# Patient Record
Sex: Female | Born: 1935 | Race: White | Hispanic: No | Marital: Married | Smoking: Never smoker
Health system: Southern US, Community
[De-identification: ages and names within clinical notes are randomized; demographics above are authoritative.]

## PROBLEM LIST (undated history)

## (undated) DIAGNOSIS — E538 Deficiency of other specified B group vitamins: Secondary | ICD-10-CM

## (undated) DIAGNOSIS — I447 Left bundle-branch block, unspecified: Secondary | ICD-10-CM

## (undated) DIAGNOSIS — E079 Disorder of thyroid, unspecified: Secondary | ICD-10-CM

## (undated) DIAGNOSIS — N184 Chronic kidney disease, stage 4 (severe): Secondary | ICD-10-CM

## (undated) DIAGNOSIS — I639 Cerebral infarction, unspecified: Secondary | ICD-10-CM

## (undated) DIAGNOSIS — D510 Vitamin B12 deficiency anemia due to intrinsic factor deficiency: Secondary | ICD-10-CM

## (undated) DIAGNOSIS — M1990 Unspecified osteoarthritis, unspecified site: Secondary | ICD-10-CM

## (undated) DIAGNOSIS — I10 Essential (primary) hypertension: Secondary | ICD-10-CM

## (undated) DIAGNOSIS — E039 Hypothyroidism, unspecified: Secondary | ICD-10-CM

## (undated) HISTORY — PX: TOTAL HIP ARTHROPLASTY: SHX124

## (undated) HISTORY — DX: Cerebral infarction, unspecified: I63.9

## (undated) HISTORY — PX: ABDOMINAL HYSTERECTOMY: SHX81

## (undated) HISTORY — PX: OTHER SURGICAL HISTORY: SHX169

---

## 2011-10-10 DIAGNOSIS — H35369 Drusen (degenerative) of macula, unspecified eye: Secondary | ICD-10-CM | POA: Diagnosis not present

## 2011-10-10 DIAGNOSIS — H353190 Nonexudative age-related macular degeneration, unspecified eye, stage unspecified: Secondary | ICD-10-CM | POA: Diagnosis not present

## 2011-10-10 DIAGNOSIS — H4011X0 Primary open-angle glaucoma, stage unspecified: Secondary | ICD-10-CM | POA: Diagnosis not present

## 2011-11-14 DIAGNOSIS — I10 Essential (primary) hypertension: Secondary | ICD-10-CM | POA: Diagnosis not present

## 2011-11-14 DIAGNOSIS — E039 Hypothyroidism, unspecified: Secondary | ICD-10-CM | POA: Diagnosis not present

## 2011-11-14 DIAGNOSIS — R5383 Other fatigue: Secondary | ICD-10-CM | POA: Diagnosis not present

## 2011-11-14 DIAGNOSIS — M159 Polyosteoarthritis, unspecified: Secondary | ICD-10-CM | POA: Diagnosis not present

## 2011-11-14 DIAGNOSIS — R5381 Other malaise: Secondary | ICD-10-CM | POA: Diagnosis not present

## 2011-11-21 DIAGNOSIS — R5383 Other fatigue: Secondary | ICD-10-CM | POA: Diagnosis not present

## 2011-11-21 DIAGNOSIS — E039 Hypothyroidism, unspecified: Secondary | ICD-10-CM | POA: Diagnosis not present

## 2011-11-21 DIAGNOSIS — R5381 Other malaise: Secondary | ICD-10-CM | POA: Diagnosis not present

## 2011-11-21 DIAGNOSIS — M159 Polyosteoarthritis, unspecified: Secondary | ICD-10-CM | POA: Diagnosis not present

## 2012-01-31 DIAGNOSIS — H40059 Ocular hypertension, unspecified eye: Secondary | ICD-10-CM | POA: Diagnosis not present

## 2012-01-31 DIAGNOSIS — H409 Unspecified glaucoma: Secondary | ICD-10-CM | POA: Diagnosis not present

## 2012-01-31 DIAGNOSIS — H4011X0 Primary open-angle glaucoma, stage unspecified: Secondary | ICD-10-CM | POA: Diagnosis not present

## 2012-06-04 DIAGNOSIS — E039 Hypothyroidism, unspecified: Secondary | ICD-10-CM | POA: Diagnosis not present

## 2012-06-04 DIAGNOSIS — Z23 Encounter for immunization: Secondary | ICD-10-CM | POA: Diagnosis not present

## 2012-06-04 DIAGNOSIS — R5381 Other malaise: Secondary | ICD-10-CM | POA: Diagnosis not present

## 2012-06-04 DIAGNOSIS — M159 Polyosteoarthritis, unspecified: Secondary | ICD-10-CM | POA: Diagnosis not present

## 2012-06-04 DIAGNOSIS — I10 Essential (primary) hypertension: Secondary | ICD-10-CM | POA: Diagnosis not present

## 2012-06-04 DIAGNOSIS — Z780 Asymptomatic menopausal state: Secondary | ICD-10-CM | POA: Diagnosis not present

## 2012-06-11 DIAGNOSIS — E039 Hypothyroidism, unspecified: Secondary | ICD-10-CM | POA: Diagnosis not present

## 2012-06-11 DIAGNOSIS — E2839 Other primary ovarian failure: Secondary | ICD-10-CM | POA: Diagnosis not present

## 2012-06-11 DIAGNOSIS — Z23 Encounter for immunization: Secondary | ICD-10-CM | POA: Diagnosis not present

## 2012-06-11 DIAGNOSIS — Z0000 Encounter for general adult medical examination without abnormal findings: Secondary | ICD-10-CM | POA: Diagnosis not present

## 2012-06-11 DIAGNOSIS — D510 Vitamin B12 deficiency anemia due to intrinsic factor deficiency: Secondary | ICD-10-CM | POA: Diagnosis not present

## 2012-06-11 DIAGNOSIS — I10 Essential (primary) hypertension: Secondary | ICD-10-CM | POA: Diagnosis not present

## 2012-06-11 DIAGNOSIS — I447 Left bundle-branch block, unspecified: Secondary | ICD-10-CM | POA: Diagnosis not present

## 2012-06-11 DIAGNOSIS — H908 Mixed conductive and sensorineural hearing loss, unspecified: Secondary | ICD-10-CM | POA: Diagnosis not present

## 2012-10-15 DIAGNOSIS — H4011X0 Primary open-angle glaucoma, stage unspecified: Secondary | ICD-10-CM | POA: Diagnosis not present

## 2012-10-15 DIAGNOSIS — H353190 Nonexudative age-related macular degeneration, unspecified eye, stage unspecified: Secondary | ICD-10-CM | POA: Diagnosis not present

## 2012-10-15 DIAGNOSIS — H409 Unspecified glaucoma: Secondary | ICD-10-CM | POA: Diagnosis not present

## 2012-12-03 DIAGNOSIS — D510 Vitamin B12 deficiency anemia due to intrinsic factor deficiency: Secondary | ICD-10-CM | POA: Diagnosis not present

## 2012-12-03 DIAGNOSIS — I10 Essential (primary) hypertension: Secondary | ICD-10-CM | POA: Diagnosis not present

## 2012-12-03 DIAGNOSIS — E039 Hypothyroidism, unspecified: Secondary | ICD-10-CM | POA: Diagnosis not present

## 2012-12-03 DIAGNOSIS — Z780 Asymptomatic menopausal state: Secondary | ICD-10-CM | POA: Diagnosis not present

## 2012-12-10 DIAGNOSIS — D510 Vitamin B12 deficiency anemia due to intrinsic factor deficiency: Secondary | ICD-10-CM | POA: Diagnosis not present

## 2012-12-10 DIAGNOSIS — E039 Hypothyroidism, unspecified: Secondary | ICD-10-CM | POA: Diagnosis not present

## 2012-12-10 DIAGNOSIS — R5381 Other malaise: Secondary | ICD-10-CM | POA: Diagnosis not present

## 2012-12-10 DIAGNOSIS — I10 Essential (primary) hypertension: Secondary | ICD-10-CM | POA: Diagnosis not present

## 2013-02-12 DIAGNOSIS — H4011X0 Primary open-angle glaucoma, stage unspecified: Secondary | ICD-10-CM | POA: Diagnosis not present

## 2013-02-12 DIAGNOSIS — H04129 Dry eye syndrome of unspecified lacrimal gland: Secondary | ICD-10-CM | POA: Diagnosis not present

## 2013-02-12 DIAGNOSIS — H409 Unspecified glaucoma: Secondary | ICD-10-CM | POA: Diagnosis not present

## 2013-06-17 DIAGNOSIS — H4011X0 Primary open-angle glaucoma, stage unspecified: Secondary | ICD-10-CM | POA: Diagnosis not present

## 2013-06-17 DIAGNOSIS — H02409 Unspecified ptosis of unspecified eyelid: Secondary | ICD-10-CM | POA: Diagnosis not present

## 2013-06-17 DIAGNOSIS — H409 Unspecified glaucoma: Secondary | ICD-10-CM | POA: Diagnosis not present

## 2013-06-24 DIAGNOSIS — Z0000 Encounter for general adult medical examination without abnormal findings: Secondary | ICD-10-CM | POA: Diagnosis not present

## 2013-06-24 DIAGNOSIS — D510 Vitamin B12 deficiency anemia due to intrinsic factor deficiency: Secondary | ICD-10-CM | POA: Diagnosis not present

## 2013-06-24 DIAGNOSIS — I10 Essential (primary) hypertension: Secondary | ICD-10-CM | POA: Diagnosis not present

## 2013-06-24 DIAGNOSIS — R5381 Other malaise: Secondary | ICD-10-CM | POA: Diagnosis not present

## 2013-06-24 DIAGNOSIS — Z1331 Encounter for screening for depression: Secondary | ICD-10-CM | POA: Diagnosis not present

## 2013-06-24 DIAGNOSIS — E039 Hypothyroidism, unspecified: Secondary | ICD-10-CM | POA: Diagnosis not present

## 2013-07-01 DIAGNOSIS — E039 Hypothyroidism, unspecified: Secondary | ICD-10-CM | POA: Diagnosis not present

## 2013-07-01 DIAGNOSIS — M159 Polyosteoarthritis, unspecified: Secondary | ICD-10-CM | POA: Diagnosis not present

## 2013-07-01 DIAGNOSIS — I10 Essential (primary) hypertension: Secondary | ICD-10-CM | POA: Diagnosis not present

## 2013-07-01 DIAGNOSIS — Z23 Encounter for immunization: Secondary | ICD-10-CM | POA: Diagnosis not present

## 2013-07-01 DIAGNOSIS — D510 Vitamin B12 deficiency anemia due to intrinsic factor deficiency: Secondary | ICD-10-CM | POA: Diagnosis not present

## 2013-07-01 DIAGNOSIS — N184 Chronic kidney disease, stage 4 (severe): Secondary | ICD-10-CM | POA: Diagnosis not present

## 2013-09-08 DIAGNOSIS — N184 Chronic kidney disease, stage 4 (severe): Secondary | ICD-10-CM | POA: Diagnosis not present

## 2013-09-16 DIAGNOSIS — E039 Hypothyroidism, unspecified: Secondary | ICD-10-CM | POA: Diagnosis not present

## 2013-09-16 DIAGNOSIS — N184 Chronic kidney disease, stage 4 (severe): Secondary | ICD-10-CM | POA: Diagnosis not present

## 2013-09-16 DIAGNOSIS — I10 Essential (primary) hypertension: Secondary | ICD-10-CM | POA: Diagnosis not present

## 2013-09-16 DIAGNOSIS — M159 Polyosteoarthritis, unspecified: Secondary | ICD-10-CM | POA: Diagnosis not present

## 2013-10-19 DIAGNOSIS — H353190 Nonexudative age-related macular degeneration, unspecified eye, stage unspecified: Secondary | ICD-10-CM | POA: Diagnosis not present

## 2013-10-19 DIAGNOSIS — H409 Unspecified glaucoma: Secondary | ICD-10-CM | POA: Diagnosis not present

## 2013-10-19 DIAGNOSIS — H4011X0 Primary open-angle glaucoma, stage unspecified: Secondary | ICD-10-CM | POA: Diagnosis not present

## 2013-10-19 DIAGNOSIS — H524 Presbyopia: Secondary | ICD-10-CM | POA: Diagnosis not present

## 2014-04-07 DIAGNOSIS — I10 Essential (primary) hypertension: Secondary | ICD-10-CM | POA: Diagnosis not present

## 2014-04-14 DIAGNOSIS — N184 Chronic kidney disease, stage 4 (severe): Secondary | ICD-10-CM | POA: Diagnosis not present

## 2014-04-14 DIAGNOSIS — E039 Hypothyroidism, unspecified: Secondary | ICD-10-CM | POA: Diagnosis not present

## 2014-04-14 DIAGNOSIS — R7301 Impaired fasting glucose: Secondary | ICD-10-CM | POA: Diagnosis not present

## 2014-04-14 DIAGNOSIS — I10 Essential (primary) hypertension: Secondary | ICD-10-CM | POA: Diagnosis not present

## 2014-06-23 DIAGNOSIS — Z23 Encounter for immunization: Secondary | ICD-10-CM | POA: Diagnosis not present

## 2014-08-11 DIAGNOSIS — H4011X3 Primary open-angle glaucoma, severe stage: Secondary | ICD-10-CM | POA: Diagnosis not present

## 2014-09-28 DIAGNOSIS — Z1389 Encounter for screening for other disorder: Secondary | ICD-10-CM | POA: Diagnosis not present

## 2014-09-28 DIAGNOSIS — E039 Hypothyroidism, unspecified: Secondary | ICD-10-CM | POA: Diagnosis not present

## 2014-09-28 DIAGNOSIS — Z0000 Encounter for general adult medical examination without abnormal findings: Secondary | ICD-10-CM | POA: Diagnosis not present

## 2014-09-28 DIAGNOSIS — I10 Essential (primary) hypertension: Secondary | ICD-10-CM | POA: Diagnosis not present

## 2014-10-06 DIAGNOSIS — D510 Vitamin B12 deficiency anemia due to intrinsic factor deficiency: Secondary | ICD-10-CM | POA: Diagnosis not present

## 2014-10-06 DIAGNOSIS — N184 Chronic kidney disease, stage 4 (severe): Secondary | ICD-10-CM | POA: Diagnosis not present

## 2014-10-06 DIAGNOSIS — E039 Hypothyroidism, unspecified: Secondary | ICD-10-CM | POA: Diagnosis not present

## 2014-10-06 DIAGNOSIS — N390 Urinary tract infection, site not specified: Secondary | ICD-10-CM | POA: Diagnosis not present

## 2014-10-06 DIAGNOSIS — I10 Essential (primary) hypertension: Secondary | ICD-10-CM | POA: Diagnosis not present

## 2014-12-09 DIAGNOSIS — H4011X3 Primary open-angle glaucoma, severe stage: Secondary | ICD-10-CM | POA: Diagnosis not present

## 2014-12-09 DIAGNOSIS — Z961 Presence of intraocular lens: Secondary | ICD-10-CM | POA: Diagnosis not present

## 2014-12-09 DIAGNOSIS — H3531 Nonexudative age-related macular degeneration: Secondary | ICD-10-CM | POA: Diagnosis not present

## 2014-12-09 DIAGNOSIS — H02403 Unspecified ptosis of bilateral eyelids: Secondary | ICD-10-CM | POA: Diagnosis not present

## 2015-04-06 DIAGNOSIS — E039 Hypothyroidism, unspecified: Secondary | ICD-10-CM | POA: Diagnosis not present

## 2015-04-06 DIAGNOSIS — N184 Chronic kidney disease, stage 4 (severe): Secondary | ICD-10-CM | POA: Diagnosis not present

## 2015-04-06 DIAGNOSIS — I10 Essential (primary) hypertension: Secondary | ICD-10-CM | POA: Diagnosis not present

## 2015-04-20 DIAGNOSIS — Z23 Encounter for immunization: Secondary | ICD-10-CM | POA: Diagnosis not present

## 2015-04-20 DIAGNOSIS — E039 Hypothyroidism, unspecified: Secondary | ICD-10-CM | POA: Diagnosis not present

## 2015-04-20 DIAGNOSIS — R7309 Other abnormal glucose: Secondary | ICD-10-CM | POA: Diagnosis not present

## 2015-04-20 DIAGNOSIS — I10 Essential (primary) hypertension: Secondary | ICD-10-CM | POA: Diagnosis not present

## 2015-04-20 DIAGNOSIS — N184 Chronic kidney disease, stage 4 (severe): Secondary | ICD-10-CM | POA: Diagnosis not present

## 2015-08-16 DIAGNOSIS — H401133 Primary open-angle glaucoma, bilateral, severe stage: Secondary | ICD-10-CM | POA: Diagnosis not present

## 2015-10-19 DIAGNOSIS — R7309 Other abnormal glucose: Secondary | ICD-10-CM | POA: Diagnosis not present

## 2015-10-19 DIAGNOSIS — E039 Hypothyroidism, unspecified: Secondary | ICD-10-CM | POA: Diagnosis not present

## 2015-10-19 DIAGNOSIS — Z0000 Encounter for general adult medical examination without abnormal findings: Secondary | ICD-10-CM | POA: Diagnosis not present

## 2015-10-19 DIAGNOSIS — I10 Essential (primary) hypertension: Secondary | ICD-10-CM | POA: Diagnosis not present

## 2015-10-19 DIAGNOSIS — N184 Chronic kidney disease, stage 4 (severe): Secondary | ICD-10-CM | POA: Diagnosis not present

## 2015-10-19 DIAGNOSIS — N390 Urinary tract infection, site not specified: Secondary | ICD-10-CM | POA: Diagnosis not present

## 2015-10-26 ENCOUNTER — Encounter (HOSPITAL_COMMUNITY): Payer: Self-pay | Admitting: *Deleted

## 2015-10-26 ENCOUNTER — Inpatient Hospital Stay (HOSPITAL_COMMUNITY)
Admission: EM | Admit: 2015-10-26 | Discharge: 2015-10-29 | DRG: 065 | Disposition: A | Discharge status: Home Health | Payer: Medicare Other | Source: Ambulatory Visit | Attending: Family Medicine | Admitting: Family Medicine

## 2015-10-26 ENCOUNTER — Emergency Department (HOSPITAL_COMMUNITY): Payer: Medicare Other

## 2015-10-26 ENCOUNTER — Observation Stay (HOSPITAL_COMMUNITY): Payer: Medicare Other

## 2015-10-26 DIAGNOSIS — I6330 Cerebral infarction due to thrombosis of unspecified cerebral artery: Secondary | ICD-10-CM

## 2015-10-26 DIAGNOSIS — Z96642 Presence of left artificial hip joint: Secondary | ICD-10-CM | POA: Diagnosis present

## 2015-10-26 DIAGNOSIS — R29810 Facial weakness: Secondary | ICD-10-CM | POA: Diagnosis present

## 2015-10-26 DIAGNOSIS — I129 Hypertensive chronic kidney disease with stage 1 through stage 4 chronic kidney disease, or unspecified chronic kidney disease: Secondary | ICD-10-CM | POA: Diagnosis present

## 2015-10-26 DIAGNOSIS — R531 Weakness: Secondary | ICD-10-CM | POA: Diagnosis not present

## 2015-10-26 DIAGNOSIS — N390 Urinary tract infection, site not specified: Secondary | ICD-10-CM | POA: Diagnosis not present

## 2015-10-26 DIAGNOSIS — I6789 Other cerebrovascular disease: Secondary | ICD-10-CM | POA: Diagnosis not present

## 2015-10-26 DIAGNOSIS — R748 Abnormal levels of other serum enzymes: Secondary | ICD-10-CM | POA: Diagnosis present

## 2015-10-26 DIAGNOSIS — I639 Cerebral infarction, unspecified: Secondary | ICD-10-CM | POA: Diagnosis not present

## 2015-10-26 DIAGNOSIS — R471 Dysarthria and anarthria: Secondary | ICD-10-CM | POA: Diagnosis not present

## 2015-10-26 DIAGNOSIS — R4781 Slurred speech: Secondary | ICD-10-CM | POA: Insufficient documentation

## 2015-10-26 DIAGNOSIS — R29818 Other symptoms and signs involving the nervous system: Secondary | ICD-10-CM | POA: Diagnosis not present

## 2015-10-26 DIAGNOSIS — R7989 Other specified abnormal findings of blood chemistry: Secondary | ICD-10-CM | POA: Diagnosis not present

## 2015-10-26 DIAGNOSIS — R4701 Aphasia: Secondary | ICD-10-CM | POA: Diagnosis present

## 2015-10-26 DIAGNOSIS — I672 Cerebral atherosclerosis: Secondary | ICD-10-CM | POA: Diagnosis present

## 2015-10-26 DIAGNOSIS — Z8673 Personal history of transient ischemic attack (TIA), and cerebral infarction without residual deficits: Secondary | ICD-10-CM | POA: Diagnosis present

## 2015-10-26 DIAGNOSIS — R29700 NIHSS score 0: Secondary | ICD-10-CM | POA: Diagnosis present

## 2015-10-26 DIAGNOSIS — N184 Chronic kidney disease, stage 4 (severe): Secondary | ICD-10-CM | POA: Diagnosis not present

## 2015-10-26 DIAGNOSIS — I454 Nonspecific intraventricular block: Secondary | ICD-10-CM | POA: Diagnosis present

## 2015-10-26 DIAGNOSIS — Z7982 Long term (current) use of aspirin: Secondary | ICD-10-CM

## 2015-10-26 DIAGNOSIS — M1990 Unspecified osteoarthritis, unspecified site: Secondary | ICD-10-CM | POA: Diagnosis present

## 2015-10-26 DIAGNOSIS — I447 Left bundle-branch block, unspecified: Secondary | ICD-10-CM | POA: Diagnosis not present

## 2015-10-26 DIAGNOSIS — G459 Transient cerebral ischemic attack, unspecified: Secondary | ICD-10-CM | POA: Diagnosis not present

## 2015-10-26 DIAGNOSIS — D510 Vitamin B12 deficiency anemia due to intrinsic factor deficiency: Secondary | ICD-10-CM | POA: Diagnosis not present

## 2015-10-26 DIAGNOSIS — E785 Hyperlipidemia, unspecified: Secondary | ICD-10-CM | POA: Diagnosis present

## 2015-10-26 DIAGNOSIS — B9620 Unspecified Escherichia coli [E. coli] as the cause of diseases classified elsewhere: Secondary | ICD-10-CM | POA: Diagnosis present

## 2015-10-26 DIAGNOSIS — E039 Hypothyroidism, unspecified: Secondary | ICD-10-CM | POA: Diagnosis present

## 2015-10-26 DIAGNOSIS — Z66 Do not resuscitate: Secondary | ICD-10-CM | POA: Diagnosis present

## 2015-10-26 DIAGNOSIS — R778 Other specified abnormalities of plasma proteins: Secondary | ICD-10-CM | POA: Insufficient documentation

## 2015-10-26 DIAGNOSIS — E538 Deficiency of other specified B group vitamins: Secondary | ICD-10-CM | POA: Diagnosis present

## 2015-10-26 DIAGNOSIS — R269 Unspecified abnormalities of gait and mobility: Secondary | ICD-10-CM | POA: Diagnosis present

## 2015-10-26 DIAGNOSIS — I10 Essential (primary) hypertension: Secondary | ICD-10-CM | POA: Diagnosis present

## 2015-10-26 HISTORY — DX: Chronic kidney disease, stage 4 (severe): N18.4

## 2015-10-26 HISTORY — DX: Deficiency of other specified B group vitamins: E53.8

## 2015-10-26 HISTORY — DX: Left bundle-branch block, unspecified: I44.7

## 2015-10-26 HISTORY — DX: Hypothyroidism, unspecified: E03.9

## 2015-10-26 HISTORY — DX: Essential (primary) hypertension: I10

## 2015-10-26 HISTORY — DX: Unspecified osteoarthritis, unspecified site: M19.90

## 2015-10-26 HISTORY — DX: Vitamin B12 deficiency anemia due to intrinsic factor deficiency: D51.0

## 2015-10-26 HISTORY — DX: Disorder of thyroid, unspecified: E07.9

## 2015-10-26 LAB — COMPREHENSIVE METABOLIC PANEL
ALBUMIN: 3.5 g/dL (ref 3.5–5.0)
ALT: 11 U/L — ABNORMAL LOW (ref 14–54)
AST: 20 U/L (ref 15–41)
Alkaline Phosphatase: 46 U/L (ref 38–126)
Anion gap: 11 (ref 5–15)
BUN: 34 mg/dL — AB (ref 6–20)
CHLORIDE: 105 mmol/L (ref 101–111)
CO2: 23 mmol/L (ref 22–32)
Calcium: 9.5 mg/dL (ref 8.9–10.3)
Creatinine, Ser: 1.69 mg/dL — ABNORMAL HIGH (ref 0.44–1.00)
GFR calc Af Amer: 28 mL/min — ABNORMAL LOW (ref >60)
GFR calc non Af Amer: 25 mL/min — ABNORMAL LOW (ref >60)
GLUCOSE: 109 mg/dL — AB (ref 65–99)
POTASSIUM: 3.9 mmol/L (ref 3.5–5.1)
SODIUM: 139 mmol/L (ref 135–145)
Total Bilirubin: 0.7 mg/dL (ref 0.3–1.2)
Total Protein: 6.2 g/dL — ABNORMAL LOW (ref 6.5–8.1)

## 2015-10-26 LAB — DIFFERENTIAL
BASOS ABS: 0 10*3/uL (ref 0.0–0.1)
BASOS PCT: 0 %
EOS ABS: 0.2 10*3/uL (ref 0.0–0.7)
Eosinophils Relative: 2 %
Lymphocytes Relative: 26 %
Lymphs Abs: 2.5 10*3/uL (ref 0.7–4.0)
Monocytes Absolute: 0.5 10*3/uL (ref 0.1–1.0)
Monocytes Relative: 6 %
NEUTROS PCT: 66 %
Neutro Abs: 6.3 10*3/uL (ref 1.7–7.7)

## 2015-10-26 LAB — CBC
HCT: 35.1 % — ABNORMAL LOW (ref 36.0–46.0)
Hemoglobin: 11.3 g/dL — ABNORMAL LOW (ref 12.0–15.0)
MCH: 30.4 pg (ref 26.0–34.0)
MCHC: 32.2 g/dL (ref 30.0–36.0)
MCV: 94.4 fL (ref 78.0–100.0)
Platelets: 200 10*3/uL (ref 150–400)
RBC: 3.72 MIL/uL — ABNORMAL LOW (ref 3.87–5.11)
RDW: 13.3 % (ref 11.5–15.5)
WBC: 9.6 10*3/uL (ref 4.0–10.5)

## 2015-10-26 LAB — PROTIME-INR
INR: 1.04 (ref 0.00–1.49)
Prothrombin Time: 13.8 seconds (ref 11.6–15.2)

## 2015-10-26 LAB — I-STAT TROPONIN, ED: TROPONIN I, POC: 0.09 ng/mL — AB (ref 0.00–0.08)

## 2015-10-26 LAB — MAGNESIUM: Magnesium: 1.7 mg/dL (ref 1.7–2.4)

## 2015-10-26 LAB — APTT: APTT: 36 s (ref 24–37)

## 2015-10-26 MED ORDER — STROKE: EARLY STAGES OF RECOVERY BOOK
Freq: Once | Status: AC
  Administered 2015-10-26: 23:00:00

## 2015-10-26 MED ORDER — DEXTROSE 5 % IV SOLN
1.0000 g | INTRAVENOUS | Status: DC
  Administered 2015-10-26 – 2015-10-28 (×2): 1 g via INTRAVENOUS
  Filled 2015-10-26 (×4): qty 10

## 2015-10-26 MED ORDER — HEPARIN SODIUM (PORCINE) 5000 UNIT/ML IJ SOLN
5000.0000 [IU] | Freq: Three times a day (TID) | INTRAMUSCULAR | Status: DC
  Administered 2015-10-26 – 2015-10-29 (×8): 5000 [IU] via SUBCUTANEOUS
  Filled 2015-10-26 (×8): qty 1

## 2015-10-26 MED ORDER — ACETAMINOPHEN 325 MG PO TABS: 650.0000 mg | ORAL_TABLET | ORAL | Status: DC | PRN

## 2015-10-26 MED ORDER — ASPIRIN 300 MG RE SUPP: 300.0000 mg | Freq: Every day | RECTAL | Status: DC

## 2015-10-26 MED ORDER — VITAMIN D 1000 UNITS PO TABS
2000.0000 [IU] | ORAL_TABLET | Freq: Every day | ORAL | Status: DC
  Administered 2015-10-26 – 2015-10-29 (×4): 2000 [IU] via ORAL
  Filled 2015-10-26 (×4): qty 2

## 2015-10-26 MED ORDER — MAGNESIUM SULFATE 50 % IJ SOLN
3.0000 g | Freq: Once | INTRAMUSCULAR | Status: AC
  Administered 2015-10-26: 3 g via INTRAVENOUS
  Filled 2015-10-26: qty 6

## 2015-10-26 MED ORDER — AMLODIPINE BESYLATE 10 MG PO TABS
10.0000 mg | ORAL_TABLET | Freq: Every day | ORAL | Status: DC
  Administered 2015-10-26 – 2015-10-29 (×3): 10 mg via ORAL
  Filled 2015-10-26 (×4): qty 1

## 2015-10-26 MED ORDER — ASPIRIN 325 MG PO TABS
325.0000 mg | ORAL_TABLET | Freq: Every day | ORAL | Status: DC
  Administered 2015-10-26 – 2015-10-27 (×2): 325 mg via ORAL
  Filled 2015-10-26 (×2): qty 1

## 2015-10-26 MED ORDER — DORZOLAMIDE HCL-TIMOLOL MAL 2-0.5 % OP SOLN
1.0000 [drp] | Freq: Two times a day (BID) | OPHTHALMIC | Status: DC
  Administered 2015-10-26 – 2015-10-29 (×6): 1 [drp] via OPHTHALMIC
  Filled 2015-10-26: qty 10

## 2015-10-26 MED ORDER — SODIUM CHLORIDE 0.9 % IV BOLUS (SEPSIS)
500.0000 mL | Freq: Once | INTRAVENOUS | Status: AC
  Administered 2015-10-26: 500 mL via INTRAVENOUS

## 2015-10-26 MED ORDER — ADULT MULTIVITAMIN W/MINERALS CH
1.0000 | ORAL_TABLET | Freq: Every day | ORAL | Status: DC
  Administered 2015-10-27 – 2015-10-29 (×3): 1 via ORAL
  Filled 2015-10-26 (×3): qty 1

## 2015-10-26 MED ORDER — SENNOSIDES-DOCUSATE SODIUM 8.6-50 MG PO TABS
1.0000 | ORAL_TABLET | Freq: Every day | ORAL | Status: DC
  Administered 2015-10-26 – 2015-10-27 (×2): 1 via ORAL
  Filled 2015-10-26 (×3): qty 1

## 2015-10-26 MED ORDER — SODIUM CHLORIDE 0.9 % IV SOLN
INTRAVENOUS | Status: AC
  Administered 2015-10-26: 18:00:00 via INTRAVENOUS

## 2015-10-26 MED ORDER — LEVOTHYROXINE SODIUM 112 MCG PO TABS
112.0000 ug | ORAL_TABLET | Freq: Every day | ORAL | Status: DC
  Administered 2015-10-27 – 2015-10-29 (×3): 112 ug via ORAL
  Filled 2015-10-26 (×3): qty 1

## 2015-10-26 MED ORDER — ONDANSETRON HCL 4 MG/2ML IJ SOLN
4.0000 mg | Freq: Four times a day (QID) | INTRAMUSCULAR | Status: DC | PRN
Start: 2015-10-26 — End: 2015-10-29

## 2015-10-26 MED ORDER — IRBESARTAN 150 MG PO TABS
75.0000 mg | ORAL_TABLET | Freq: Every day | ORAL | Status: DC
  Administered 2015-10-27 – 2015-10-29 (×2): 75 mg via ORAL
  Filled 2015-10-26 (×3): qty 1

## 2015-10-26 MED ORDER — ASPIRIN 325 MG PO TABS
325.0000 mg | ORAL_TABLET | Freq: Once | ORAL | Status: AC
  Administered 2015-10-26: 325 mg via ORAL
  Filled 2015-10-26: qty 1

## 2015-10-26 MED ORDER — LATANOPROST 0.005 % OP SOLN
1.0000 [drp] | Freq: Every day | OPHTHALMIC | Status: DC
  Administered 2015-10-26 – 2015-10-28 (×3): 1 [drp] via OPHTHALMIC
  Filled 2015-10-26: qty 2.5

## 2015-10-26 MED ORDER — ACETAMINOPHEN 650 MG RE SUPP: 650.0000 mg | RECTAL | Status: DC | PRN

## 2015-10-26 NOTE — Consult Note (Signed)
Neurology Consultation Reason for Consult: Stroke like symptoms Referring Physician: Janee Morn, D  CC: Slurred speech  History is obtained from: Patient  HPI: Jennifer Pittman is a 82 y.o. female with a history of previous stroke with who is very active and independent at baseline, still living at home. She noticed yesterday morning that she was slurring her speech. Her daughter gave her something to eat and she noticed that it was drooling out the side of her mouth. Today, she saw her primary care provider who insisted she come to the emergency room for further evaluation and therefore she has presented here. She still feels like she is slurring her speech, and that her left side of her face is not quite as good as her right.   LKW: 2/28 tpa given?: no, out of window Premorbid modified rankin scale: 2    ROS: A 14 point ROS was performed and is negative except as noted in the HPI.   Past Medical History  Diagnosis Date  . Hypertension   . Thyroid disease   . Bundle branch block, left   . Pernicious anemia   . Osteoarthritis   . B12 deficiency 10/26/2015  . Hypothyroidism 10/26/2015  . CKD (chronic kidney disease), stage IV (HCC) 10/26/2015     History reviewed. No pertinent family history.   Social History:  reports that she has never smoked. She has never used smokeless tobacco. She reports that she does not drink alcohol or use illicit drugs.   Exam: Current vital signs: BP 161/82 mmHg  Pulse 68  Temp(Src) 97.5 F (36.4 C) (Oral)  Resp 20  SpO2 99% Vital signs in last 24 hours: Temp:  [97.5 F (36.4 C)] 97.5 F (36.4 C) (03/01 1859) Pulse Rate:  [66-76] 68 (03/01 1859) Resp:  [17-22] 20 (03/01 1859) BP: (158-169)/(69-82) 161/82 mmHg (03/01 1859) SpO2:  [95 %-99 %] 99 % (03/01 1859)   Physical Exam  Constitutional: Appears well-developed and well-nourished.  Psych: Affect appropriate to situation Eyes: No scleral injection HENT: No OP obstrucion Head:  Normocephalic.  Cardiovascular: Normal rate and regular rhythm.  Respiratory: Effort normal and breath sounds normal to anterior ascultation GI: Soft.  No distension. There is no tenderness.  Skin: WDI  Neuro: Mental Status: Patient is awake, alert, oriented to person, place, month, year, and situation. Patient is able to give a clear and coherent history. No signs of aphasia or neglect Cranial Nerves: II: Visual Fields are full. Pupils are equal, round, and reactive to light.   III,IV, VI: EOMI without ptosis or diploplia.  V: Facial sensation is symmetric to temperature VII: Facial movement is very mildly decreased on the left, with a mild lag on smile. VIII: hearing is intact to voice X: Uvula elevates symmetrically XI: Shoulder shrug is symmetric. XII: tongue is midline without atrophy or fasciculations.  Motor: Tone is normal. Bulk is normal. 5/5 strength was present in all four extremities.  Sensory: Sensation is symmetric to light touch and temperature in the arms and legs. Cerebellar: FNF  intact bilaterally    I have reviewed labs in epic and the results pertinent to this consultation are: Mildly elevated creatinine  I have reviewed the images obtained: CT head-no acute findings  Impression: 82 year old female with a history of stroke who presents with new onset left facial weakness and slurred speech. I do suspect that she has likely had a small subcortical infarct. She is receiving an evaluation for this.  Recommendations: 1. HgbA1c, fasting lipid panel 2. MRI,  MRA  of the brain without contrast 3. Frequent neuro checks 4. Echocardiogram 5. Carotid dopplers 6. Prophylactic therapy-Antiplatelet med: Aspirin - dose  PO or  PR 7. Risk factor modification 8. Telemetry monitoring 9. PT consult, OT consult, Speech consult 10. please page stroke NP  Or  PA  Or MD  M-F from 8am -4 pm starting 3/2 as this patient will be followed by the stroke team at this  point.   You can look them up on [email or web address]  Password [email or web address]    Ritta Slot, MD [facility] [phone]  If 7pm- 7am, please page neurology on call as listed in AMION.

## 2015-10-26 NOTE — ED Notes (Signed)
Pt arrives via [facility] from doctors office because of left facial droop that was noted yesterday but pt refused to seek medical care. Speech appropriate.

## 2015-10-26 NOTE — ED Provider Notes (Signed)
CSN: [id number]     Arrival date & time 10/26/15  1505 History   First MD Initiated Contact with Patient 10/26/15 1507     Chief Complaint  Patient presents with  . Facial Droop     (Consider location/radiation/quality/duration/timing/severity/associated sxs/prior Treatment) The history is provided by the patient, the EMS personnel and a relative.  Jennifer Pittman is a 82 y.o. female hx of HTN, arthritis here with facial droop, slurred speech. Per the family, patient has left facial droop and trouble speaking since yesterday. Want her to come and get checked out yesterday but she refused to come. Today she saw her primary care doctor in [facility], and sent here for evaluation. She had a history of stroke about 12 years ago. She is very functional at baseline and is still living at home. She walks with a walker at baseline.    Past Medical History  Diagnosis Date  . Hypertension   . Thyroid disease   . Bundle branch block, left   . Pernicious anemia   . Osteoarthritis    Past Surgical History  Procedure Laterality Date  . Hyterectomy    . Abdominal hysterectomy    . Total hip arthroplasty Left    No family history on file. Social History  Substance Use Topics  . Smoking status: Never Smoker   . Smokeless tobacco: Never Used  . Alcohol Use: No   OB History    No data available     Review of Systems  Neurological: Positive for facial asymmetry and speech difficulty.  All other systems reviewed and are negative.     Allergies  Review of patient's allergies indicates no known allergies.  Home Medications   Prior to Admission medications   Medication Sig Start Date End Date Taking? Authorizing Provider  amLODipine (NORVASC) 10 MG tablet Take 10 mg by mouth daily.   Yes Historical Provider, MD  aspirin EC 81 MG tablet Take 81 mg by mouth daily.   Yes Historical Provider, MD  Cholecalciferol (VITAMIN D) 2000 units tablet Take 2,000 Units by mouth daily.   Yes  Historical Provider, MD  Cyanocobalamin (B-12 IJ) Inject 1,000 mcg as directed every 30 (thirty) days.   Yes Historical Provider, MD  dorzolamide-timolol (COSOPT) 22.3-6.8 MG/ML ophthalmic solution Place 1 drop into both eyes 2 (two) times daily. 09/06/15  Yes Historical Provider, MD  hydrochlorothiazide (HYDRODIURIL) 50 MG tablet Take 50 mg by mouth daily. 09/06/15  Yes Historical Provider, MD  latanoprost (XALATAN) 0.005 % ophthalmic solution Place 1 drop into both eyes at bedtime. 09/07/15  Yes Historical Provider, MD  levothyroxine (SYNTHROID, LEVOTHROID) 112 MCG tablet Take 112 mcg by mouth daily before breakfast. 09/06/15  Yes Historical Provider, MD  Multiple Vitamin (MULTIVITAMIN WITH MINERALS) TABS tablet Take 1 tablet by mouth daily.   Yes Historical Provider, MD  telmisartan (MICARDIS) 20 MG tablet Take 20 mg by mouth daily. 09/06/15  Yes Historical Provider, MD   There were no vitals taken for this visit. Physical Exam  Constitutional: She is oriented to person, place, and time. She appears well-developed and well-nourished.  Well appearing for age   HENT:  Head: Normocephalic.  Mouth/Throat: Oropharynx is clear and moist.  Eyes: Conjunctivae are normal. Pupils are equal, round, and reactive to light.  Neck: Normal range of motion. Neck supple.  Cardiovascular: Normal rate, regular rhythm and normal heart sounds.   Pulmonary/Chest: Effort normal and breath sounds normal. No respiratory distress. She has no wheezes. She has no rales.  Abdominal: Soft. Bowel sounds are normal. She exhibits no distension. There is no tenderness. There is no rebound.  Musculoskeletal: Normal range of motion. She exhibits no edema or tenderness.  Neurological: She is alert and oriented to person, place, and time.  L facial droop. Some slurred speech. Nl strength bilateral arms and legs   Skin: Skin is warm and dry.  Psychiatric: She has a normal mood and affect. Her behavior is normal. Judgment and thought  content normal.  Nursing note and vitals reviewed.   ED Course  Procedures (including critical care time) Labs Review Labs Reviewed  CBC - Abnormal; Notable for the following:    RBC 3.72 (*)    Hemoglobin 11.3 (*)    HCT 35.1 (*)    All other components within normal limits  COMPREHENSIVE METABOLIC PANEL - Abnormal; Notable for the following:    Glucose, Bld 109 (*)    BUN 34 (*)    Creatinine, Ser 1.69 (*)    Total Protein 6.2 (*)    ALT 11 (*)    GFR calc non Af Amer 25 (*)    GFR calc Af Amer 28 (*)    All other components within normal limits  I-STAT TROPOININ, ED - Abnormal; Notable for the following:    Troponin i, poc 0.09 (*)    All other components within normal limits  DIFFERENTIAL  PROTIME-INR  APTT  I-STAT CHEM 8, ED  CBG MONITORING, ED    Imaging Review Ct Head Wo Contrast  10/26/2015  CLINICAL DATA:  Left facial droop today.  Initial encounter. EXAM: CT HEAD WITHOUT CONTRAST TECHNIQUE: Contiguous axial images were obtained from the base of the skull through the vertex without intravenous contrast. COMPARISON:  None. FINDINGS: There is cortical atrophy and chronic microvascular ischemic change. No evidence of acute intracranial abnormality including hemorrhage, infarct, midline shift or abnormal extra-axial fluid collection is identified. No hydrocephalus or pneumocephalus. The calvarium is intact. Imaged paranasal sinuses and mastoid air cells are clear. IMPRESSION: No acute abnormality. Mild atrophy and chronic microvascular ischemic change. Electronically Signed   By: Drusilla Kanner M.D.   On: 10/26/2015 16:07   I have personally reviewed and evaluated these images and lab results as part of my medical decision-making.   EKG Interpretation   Date/Time:  Wednesday October 26 2015 15:13:14 EST Ventricular Rate:  68 PR Interval:  184 QRS Duration: 151 QT Interval:  424 QTC Calculation: 451 R Axis:   -16 Text Interpretation:  Sinus rhythm Ventricular  premature complex Left  bundle branch block No previous ECGs available Confirmed by Conor Filsaime  MD, Bakary Bramer  ([id number]) on 10/26/2015 3:21:19 PM      MDM   Final diagnoses:  None   Jennifer Pittman is a 82 y.o. female here with l facial droop, slurred speech. Outside of window for TPA. Will do stroke workup and consult neuro.    4:27 PM CT showed no obvious bleeding. Consulted Dr. Lavon Paganini to see patient. Trop 0.09, but no chest pain, EKG showed LBBB, no STEMI and no previous in our system. Since she is 89 and has no chest pain, will give ASA for now and I don't think she need further cardiac workup. Will admit for stroke workup.    Richardean Canal, MD 10/26/15 [phone]

## 2015-10-26 NOTE — Progress Notes (Signed)
Patient is requesting to sedated for MRI has  History of severe anxiety with MTI procedures.

## 2015-10-26 NOTE — Progress Notes (Signed)
Pt arrived to 5M15 , Pt A&Ox 4, c/o pain 0/10. Pt VS taken, pt on O2 from PACU, Fluids runningat 75 cc/hr. Pt without distress. Family at the bedside. Diet ordered, will monitor.

## 2015-10-26 NOTE — H&P (Signed)
[facility] History and Physical  Jennifer Pittman ZOX:[id number] DOB: February 21, 1918 DOA: 10/26/2015  Referring physician: Dr Silverio Lay PCP: Jennifer Fick, MD   Chief Complaint: Facial droop  HPI: Jennifer Pittman is a 82 y.o. female  His functional at baseline with history of hypertension, hypothyroidism, left bundle branch block, chronic kidney disease stage IV, pernicious anemia presented to the ED with a one-day history of slurred speech/dysarthria with an expressive aphasia and slight left facial droop. History is obtained from patient's daughter-in-law, Jennifer Pittman and per PCPs notes. One day prior to admission patient's daughter-in-law noticed that patient had a generalized weakness and fatigue with some gait instability. As the day progressed it was noticed that patient's speech was slightly slurred and dysarthric with some difficulty swallowing as patient felt like her tongue was swollen. Was also noted that patient had some drooling on the left side of her mouth with some expressive aphasia. Patient was asked to go to the emergency room however patient declined as she had had an appointment to see her PCP on the day of admission. On the morning of admission patient was still not feeling better continued to have some drooling while drinking coffee and subsequently presented to the PCPs office. Patient was noted to have a slight left facial droop was subsequently sent to the emergency room. Patient denies any fevers, no nausea, no vomiting, no chest pain, no chills, no shortness of breath, no abdominal pain, no diarrhea, no constipation, no visual changes, no asymmetric weakness or numbness, no cough, no melena, no hematemesis, no hematochezia. Patient did endorse feeling cold as well as some dysuria. Patient's daughter-in-law stated that patient was diagnosed with a UTI approximately one week prior to admission however has not been treated for it. In the ED comprehensive metabolic profile drawn at a BUN  of 34 creatinine of 1.69 otherwise was within normal limits. Point-of-care troponin was slightly elevated at 0.09. CBC had a hemoglobin of 11.3 otherwise was within normal limits. CT head which was done was negative for any acute abnormalities. EKG done showed a left bundle branch block which is chronic per PCPs notes. Patient was given an aspirin. Neurology was consulted. [facility] called to admit the patient for further evaluation and management.   Review of Systems: Per history of present illness otherwise negative. Constitutional:  No weight loss, night sweats, Fevers, chills, fatigue.  HEENT:  No headaches, Difficulty swallowing,Tooth/dental problems,Sore throat,  No sneezing, itching, ear ache, nasal congestion, post nasal drip,  Cardio-vascular:  No chest pain, Orthopnea, PND, swelling in lower extremities, anasarca, dizziness, palpitations  GI:  No heartburn, indigestion, abdominal pain, nausea, vomiting, diarrhea, change in bowel habits, loss of appetite  Resp:  No shortness of breath with exertion or at rest. No excess mucus, no productive cough, No non-productive cough, No coughing up of blood.No change in color of mucus.No wheezing.No chest wall deformity  Skin:  no rash or lesions.  GU:  no dysuria, change in color of urine, no urgency or frequency. No flank pain.  Musculoskeletal:  No joint pain or swelling. No decreased range of motion. No back pain.  Psych:  No change in mood or affect. No depression or anxiety. No memory loss.   Past Medical History  Diagnosis Date  . Hypertension   . Thyroid disease   . Bundle branch block, left   . Pernicious anemia   . Osteoarthritis   . B12 deficiency 10/26/2015  . Hypothyroidism 10/26/2015  . CKD (chronic kidney disease), stage IV (HCC) 10/26/2015  Past Surgical History  Procedure Laterality Date  . Hyterectomy    . Abdominal hysterectomy    . Total hip arthroplasty Left    Social History:  reports that she has  never smoked. She has never used smokeless tobacco. She reports that she does not drink alcohol or use illicit drugs.  No Known Allergies  History reviewed. No pertinent family history.   Prior to Admission medications   Medication Sig Start Date End Date Taking? Authorizing Provider  amLODipine (NORVASC) 10 MG tablet Take 10 mg by mouth daily.   Yes Historical Provider, MD  aspirin EC 81 MG tablet Take 81 mg by mouth daily.   Yes Historical Provider, MD  Cholecalciferol (VITAMIN D) 2000 units tablet Take 2,000 Units by mouth daily.   Yes Historical Provider, MD  Cyanocobalamin (B-12 IJ) Inject 1,000 mcg as directed every 30 (thirty) days.   Yes Historical Provider, MD  dorzolamide-timolol (COSOPT) 22.3-6.8 MG/ML ophthalmic solution Place 1 drop into both eyes 2 (two) times daily. 09/06/15  Yes Historical Provider, MD  hydrochlorothiazide (HYDRODIURIL) 50 MG tablet Take 50 mg by mouth daily. 09/06/15  Yes Historical Provider, MD  latanoprost (XALATAN) 0.005 % ophthalmic solution Place 1 drop into both eyes at bedtime. 09/07/15  Yes Historical Provider, MD  levothyroxine (SYNTHROID, LEVOTHROID) 112 MCG tablet Take 112 mcg by mouth daily before breakfast. 09/06/15  Yes Historical Provider, MD  Multiple Vitamin (MULTIVITAMIN WITH MINERALS) TABS tablet Take 1 tablet by mouth daily.   Yes Historical Provider, MD  telmisartan (MICARDIS) 20 MG tablet Take 20 mg by mouth daily. 09/06/15  Yes Historical Provider, MD   Physical Exam: Filed Vitals:   10/26/15 1800 10/26/15 1815 10/26/15 1830 10/26/15 1859  BP: 169/79 158/70 161/69 161/82  Pulse: 68 76 66 68  Temp:    97.5 F (36.4 C)  TempSrc:    Oral  Resp: 17 22  20   SpO2: 97% 95% 96% 99%    Wt Readings from Last 3 Encounters:  No data found for Wt    General:  Well-developed well-nourished female lying on gurney in no acute cardiopulmonary distress. Speaking in full sentences with some dysarthric speech.  Eyes: PERRLA, EOMI, normal lids,  irises & conjunctiva ENT: grossly normal hearing, lips & tongue Neck: no LAD, masses or thyromegaly Cardiovascular: RRR, no m/r/g. No LE edema. Respiratory: CTA bilaterally, no w/r/r. Normal respiratory effort. Abdomen: soft, ntnd Skin: no rash or induration seen on limited exam Musculoskeletal: grossly normal tone BUE/BLE. Psychiatric: grossly normal mood and affect, speech fluent and appropriate Neurologic: Alert and oriented 3. Cranial nerves II through XII grossly intact except a slight left facial weakness. Finger-to-nose is intact. Visual fields are intact. Sensation is intact. 5 /5 bilateral upper extremity strength. 5/5 bilateral lower extremity strength. Unable to elicit reflexes symmetrically and diffusely. Gait not tested secondary to safety.           Labs on Admission:  Basic Metabolic Panel:  Recent Labs Lab 10/26/15 1520  NA 139  K 3.9  CL 105  CO2 23  GLUCOSE 109*  BUN 34*  CREATININE 1.69*  CALCIUM 9.5   Liver Function Tests:  Recent Labs Lab 10/26/15 1520  AST 20  ALT 11*  ALKPHOS 46  BILITOT 0.7  PROT 6.2*  ALBUMIN 3.5   No results for input(s): LIPASE, AMYLASE in the last 168 hours. No results for input(s): AMMONIA in the last 168 hours. CBC:  Recent Labs Lab 10/26/15 1520  WBC 9.6  NEUTROABS  6.3  HGB 11.3*  HCT 35.1*  MCV 94.4  PLT 200   Cardiac Enzymes: No results for input(s): CKTOTAL, CKMB, CKMBINDEX, TROPONINI in the last 168 hours.  BNP (last 3 results) No results for input(s): BNP in the last 8760 hours.  ProBNP (last 3 results) No results for input(s): PROBNP in the last 8760 hours.  CBG: No results for input(s): GLUCAP in the last 168 hours.  Radiological Exams on Admission: Ct Head Wo Contrast  10/26/2015  CLINICAL DATA:  Left facial droop today.  Initial encounter. EXAM: CT HEAD WITHOUT CONTRAST TECHNIQUE: Contiguous axial images were obtained from the base of the skull through the vertex without intravenous contrast.  COMPARISON:  None. FINDINGS: There is cortical atrophy and chronic microvascular ischemic change. No evidence of acute intracranial abnormality including hemorrhage, infarct, midline shift or abnormal extra-axial fluid collection is identified. No hydrocephalus or pneumocephalus. The calvarium is intact. Imaged paranasal sinuses and mastoid air cells are clear. IMPRESSION: No acute abnormality. Mild atrophy and chronic microvascular ischemic change. Electronically Signed   By: Drusilla Kanner M.D.   On: 10/26/2015 16:07    EKG: Independently reviewed. Left bundle branch block. Chronic per PCPs notes.  Assessment/Plan Principal Problem:   Facial droop Active Problems:   Stroke (cerebrum) (HCC)   HTN (hypertension)   BBB (bundle branch block)   Pernicious anemia   Osteoarthritis   E. coli UTI: 10/19/2015 at PCP office   B12 deficiency   Hypothyroidism   Hyperlipidemia   CKD (chronic kidney disease), stage IV (HCC)  #1 slight left facial droop/dysarthric speech Concern for acute CVA. Patient with remote history of CVA, hypertension, hyperlipidemia, chronic kidney disease stage IV. CT head negative for any acute abnormalities. Admit to telemetry. Patient wanted to go to stroke workup including MRI/MRA of the head, 2-D echo, carotid Dopplers, fasting lipid panel, hemoglobin A1c, PT/OT/ST. Patient was on baby aspirin chronically at home which she states she's been compliant with. Continue full dose aspirin. Neurology has been consulted for further evaluation and management.  #2 recently diagnosed Escherichia coli UTI Prolapse on PCPs office urinalysis obtained 10/19/2015. Family patient was not treated yet. Patient with some dysuria. Repeat UA with cultures and sensitivities. Placed empirically on IV Rocephin as sensitivities from PCPs office shows resistance only to tetracyclines otherwise pansensitive.  #3 hypertension Permissive hypertension. Resume home dose Norvasc and micardis.  #4  hypothyroidism Check a TSH. Continue home dose Synthroid.  #5 hyperlipidemia Check a fasting lipid panel. Continue fish oil.  #6 chronic kidney disease stage IV Stable.   #7 elevated troponin Point-of-care troponin slightly elevated. Patient denies any chest pain. EKG with left bundle branch block. Cycle cardiac enzymes every 6 hours 3. Check a 2-D echo.  #8 prophylaxis Lovenox for DVT prophylaxis.   Code Status: DO NOT RESUSCITATE DVT Prophylaxis: Lovenox Family Communication: Updated patient and daughter-in-law at bedside. Disposition Plan: Admit to telemetry  Time spent: 40 MINS  [facility] MD [facility] Pager [phone]

## 2015-10-27 ENCOUNTER — Ambulatory Visit (HOSPITAL_BASED_OUTPATIENT_CLINIC_OR_DEPARTMENT_OTHER): Payer: Medicare Other

## 2015-10-27 ENCOUNTER — Observation Stay (HOSPITAL_COMMUNITY): Payer: Medicare Other

## 2015-10-27 ENCOUNTER — Observation Stay (HOSPITAL_BASED_OUTPATIENT_CLINIC_OR_DEPARTMENT_OTHER): Payer: Medicare Other

## 2015-10-27 DIAGNOSIS — I639 Cerebral infarction, unspecified: Secondary | ICD-10-CM | POA: Diagnosis not present

## 2015-10-27 DIAGNOSIS — R269 Unspecified abnormalities of gait and mobility: Secondary | ICD-10-CM | POA: Diagnosis present

## 2015-10-27 DIAGNOSIS — M1990 Unspecified osteoarthritis, unspecified site: Secondary | ICD-10-CM | POA: Diagnosis present

## 2015-10-27 DIAGNOSIS — Z8673 Personal history of transient ischemic attack (TIA), and cerebral infarction without residual deficits: Secondary | ICD-10-CM | POA: Diagnosis not present

## 2015-10-27 DIAGNOSIS — R4781 Slurred speech: Secondary | ICD-10-CM | POA: Diagnosis present

## 2015-10-27 DIAGNOSIS — I6789 Other cerebrovascular disease: Secondary | ICD-10-CM

## 2015-10-27 DIAGNOSIS — N390 Urinary tract infection, site not specified: Secondary | ICD-10-CM | POA: Diagnosis present

## 2015-10-27 DIAGNOSIS — I447 Left bundle-branch block, unspecified: Secondary | ICD-10-CM | POA: Diagnosis present

## 2015-10-27 DIAGNOSIS — E039 Hypothyroidism, unspecified: Secondary | ICD-10-CM | POA: Diagnosis present

## 2015-10-27 DIAGNOSIS — I63312 Cerebral infarction due to thrombosis of left middle cerebral artery: Secondary | ICD-10-CM | POA: Diagnosis not present

## 2015-10-27 DIAGNOSIS — N184 Chronic kidney disease, stage 4 (severe): Secondary | ICD-10-CM | POA: Diagnosis present

## 2015-10-27 DIAGNOSIS — R4701 Aphasia: Secondary | ICD-10-CM | POA: Diagnosis present

## 2015-10-27 DIAGNOSIS — B9620 Unspecified Escherichia coli [E. coli] as the cause of diseases classified elsewhere: Secondary | ICD-10-CM | POA: Diagnosis present

## 2015-10-27 DIAGNOSIS — R748 Abnormal levels of other serum enzymes: Secondary | ICD-10-CM | POA: Diagnosis present

## 2015-10-27 DIAGNOSIS — Z96642 Presence of left artificial hip joint: Secondary | ICD-10-CM | POA: Diagnosis present

## 2015-10-27 DIAGNOSIS — R29810 Facial weakness: Secondary | ICD-10-CM

## 2015-10-27 DIAGNOSIS — D510 Vitamin B12 deficiency anemia due to intrinsic factor deficiency: Secondary | ICD-10-CM | POA: Diagnosis present

## 2015-10-27 DIAGNOSIS — R471 Dysarthria and anarthria: Secondary | ICD-10-CM | POA: Diagnosis present

## 2015-10-27 DIAGNOSIS — Z7982 Long term (current) use of aspirin: Secondary | ICD-10-CM | POA: Diagnosis not present

## 2015-10-27 DIAGNOSIS — R29700 NIHSS score 0: Secondary | ICD-10-CM | POA: Diagnosis present

## 2015-10-27 DIAGNOSIS — Z66 Do not resuscitate: Secondary | ICD-10-CM | POA: Diagnosis present

## 2015-10-27 DIAGNOSIS — E785 Hyperlipidemia, unspecified: Secondary | ICD-10-CM | POA: Diagnosis present

## 2015-10-27 DIAGNOSIS — I129 Hypertensive chronic kidney disease with stage 1 through stage 4 chronic kidney disease, or unspecified chronic kidney disease: Secondary | ICD-10-CM | POA: Diagnosis present

## 2015-10-27 DIAGNOSIS — I672 Cerebral atherosclerosis: Secondary | ICD-10-CM | POA: Diagnosis present

## 2015-10-27 LAB — CBC
HEMATOCRIT: 30.4 % — AB (ref 36.0–46.0)
HEMOGLOBIN: 10.3 g/dL — AB (ref 12.0–15.0)
MCH: 31.8 pg (ref 26.0–34.0)
MCHC: 33.9 g/dL (ref 30.0–36.0)
MCV: 93.8 fL (ref 78.0–100.0)
Platelets: 181 10*3/uL (ref 150–400)
RBC: 3.24 MIL/uL — ABNORMAL LOW (ref 3.87–5.11)
RDW: 13.3 % (ref 11.5–15.5)
WBC: 7.3 10*3/uL (ref 4.0–10.5)

## 2015-10-27 LAB — POCT I-STAT, CHEM 8
BUN: 36 mg/dL — AB (ref 6–20)
CREATININE: 1.7 mg/dL — AB (ref 0.44–1.00)
Calcium, Ion: 1.14 mmol/L (ref 1.13–1.30)
Chloride: 105 mmol/L (ref 101–111)
Glucose, Bld: 104 mg/dL — ABNORMAL HIGH (ref 65–99)
HEMATOCRIT: 37 % (ref 36.0–46.0)
HEMOGLOBIN: 12.6 g/dL (ref 12.0–15.0)
POTASSIUM: 3.8 mmol/L (ref 3.5–5.1)
SODIUM: 137 mmol/L (ref 135–145)
TCO2: 21 mmol/L (ref 0–100)

## 2015-10-27 LAB — BASIC METABOLIC PANEL
Anion gap: 8 (ref 5–15)
BUN: 30 mg/dL — ABNORMAL HIGH (ref 6–20)
CHLORIDE: 106 mmol/L (ref 101–111)
CO2: 24 mmol/L (ref 22–32)
CREATININE: 1.58 mg/dL — AB (ref 0.44–1.00)
Calcium: 8.9 mg/dL (ref 8.9–10.3)
GFR calc Af Amer: 31 mL/min — ABNORMAL LOW (ref >60)
GFR calc non Af Amer: 27 mL/min — ABNORMAL LOW (ref >60)
GLUCOSE: 96 mg/dL (ref 65–99)
Potassium: 3.4 mmol/L — ABNORMAL LOW (ref 3.5–5.1)
Sodium: 138 mmol/L (ref 135–145)

## 2015-10-27 LAB — TROPONIN I
TROPONIN I: 0.19 ng/mL — AB (ref <0.031)
Troponin I: 0.15 ng/mL — ABNORMAL HIGH (ref <0.031)
Troponin I: 0.16 ng/mL — ABNORMAL HIGH (ref <0.031)

## 2015-10-27 LAB — LIPID PANEL
CHOLESTEROL: 198 mg/dL (ref 0–200)
HDL: 58 mg/dL (ref >40)
LDL CALC: 128 mg/dL — AB (ref 0–99)
TRIGLYCERIDES: 61 mg/dL (ref <150)
Total CHOL/HDL Ratio: 3.4 RATIO
VLDL: 12 mg/dL (ref 0–40)

## 2015-10-27 LAB — TSH: TSH: 2.755 u[IU]/mL (ref 0.350–4.500)

## 2015-10-27 LAB — MAGNESIUM: MAGNESIUM: 2.7 mg/dL — AB (ref 1.7–2.4)

## 2015-10-27 MED ORDER — LORAZEPAM 2 MG/ML IJ SOLN
0.5000 mg | Freq: Once | INTRAMUSCULAR | Status: AC | PRN
  Administered 2015-10-27: 0.5 mg via INTRAVENOUS
  Filled 2015-10-27: qty 1

## 2015-10-27 MED ORDER — CLOPIDOGREL BISULFATE 75 MG PO TABS
75.0000 mg | ORAL_TABLET | Freq: Every day | ORAL | Status: DC
  Administered 2015-10-28 – 2015-10-29 (×2): 75 mg via ORAL
  Filled 2015-10-27 (×2): qty 1

## 2015-10-27 MED ORDER — ATORVASTATIN CALCIUM 10 MG PO TABS
10.0000 mg | ORAL_TABLET | Freq: Every day | ORAL | Status: DC
  Administered 2015-10-28: 10 mg via ORAL
  Filled 2015-10-27: qty 1

## 2015-10-27 NOTE — Progress Notes (Signed)
STROKE TEAM PROGRESS NOTE   HISTORY OF PRESENT ILLNESS Jennifer Pittman is a 82 y.o. female with a history of previous stroke with who is very active and independent at baseline, still living at home. She noticed yesterday morning that she was slurring her speech. Her daughter gave her something to eat and she noticed that it was drooling out the side of her mouth (LKW 10/25/2015, time unknown). Today, she saw her primary care provider who insisted she come to the emergency room for further evaluation and therefore she has presented here. She still feels like she is slurring her speech, and that her left side of her face is not quite as good as her right. Premorbid modified rankin scale: 2. Patient was not administered TPA secondary to  delay in arrival. She was admitted for further evaluation and treatment.   SUBJECTIVE (INTERVAL HISTORY) Her daughter-in-law is at the bedside.  Overall she feels her condition is rapidly improving. She is walking in the hall with PT. Reports she is having some problems drools on the L. Feels speech is better. Lives with daughter in law.   OBJECTIVE Temp:  [97.5 F (36.4 C)-98.4 F (36.9 C)] 97.7 F (36.5 C) (03/02 0953) Pulse Rate:  [50-76] 75 (03/02 0953) Cardiac Rhythm:  [-] Sinus bradycardia;Heart block (03/02 0700) Resp:  [15-22] 15 (03/02 0953) BP: (126-169)/(55-82) 145/65 mmHg (03/02 0953) SpO2:  [94 %-100 %] 100 % (03/02 0953) Weight:  [56.019 kg (123 lb 8 oz)] 56.019 kg (123 lb 8 oz) (03/02 0500)  CBC:   Recent Labs Lab 10/26/15 1520 10/26/15 1537 10/27/15 0639  WBC 9.6  --  7.3  NEUTROABS 6.3  --   --   HGB 11.3* 12.6 10.3*  HCT 35.1* 37.0 30.4*  MCV 94.4  --  93.8  PLT 200  --  181    Basic Metabolic Panel:   Recent Labs Lab 10/26/15 1520 10/26/15 1537 10/26/15 2010 10/27/15 0639  NA 139 137  --  138  K 3.9 3.8  --  3.4*  CL 105 105  --  106  CO2 23  --   --  24  GLUCOSE 109* 104*  --  96  BUN 34* 36*  --  30*  CREATININE  1.69* 1.70*  --  1.58*  CALCIUM 9.5  --   --  8.9  MG  --   --  1.7 2.7*    Lipid Panel:     Component Value Date/Time   CHOL 198 10/27/2015 0106   TRIG 61 10/27/2015 0106   HDL 58 10/27/2015 0106   CHOLHDL 3.4 10/27/2015 0106   VLDL 12 10/27/2015 0106   LDLCALC 128* 10/27/2015 0106   HgbA1c: No results found for: HGBA1C Urine Drug Screen: No results found for: LABOPIA, COCAINSCRNUR, LABBENZ, AMPHETMU, THCU, LABBARB    IMAGING I have personally reviewed the radiological images below and agree with the radiology interpretations.  Dg Chest 2 View 10/26/2015   1. No radiographic evidence of acute cardiopulmonary disease. 2. Atherosclerosis.   Ct Head Wo Contrast 10/26/2015   No acute abnormality. Mild atrophy and chronic microvascular ischemic change.   MRI HEAD 10/27/2015  Acute nonhemorrhagic small to moderate-size infarct posterior right lenticular nucleus extending to posterior right corona radiata with involvement of the right external capsule. No intracranial hemorrhage. Very mild small vessel disease changes. Mild global atrophy without hydrocephalus.   MRA HEAD  10/27/2015 Mild to moderate narrowing supraclinoid aspect of the internal carotid artery bilaterally. Left posterior communicating  artery 3 mm bulge suggestive of aneurysm rather than prominent infundibulum Hypoplastic A1 segment right anterior cerebral artery moderate to marked narrowing. Mild narrowing M1 segment right middle cerebral artery. Moderate narrowing M1 segment left middle cerebral artery. Middle cerebral artery moderate to marked branch vessel narrowing and irregularity bilaterally. A2 segment anterior cerebral artery mild to slightly moderate narrowing more notable on the right. Right vertebral artery ends in a posterior inferior cerebellar artery distribution with moderate to marked narrowing at the junction of the right vertebral artery and formation of the right posterior inferior cerebellar artery. Moderate to  marked narrowing distal left vertebral artery. Poor delineation of the left posterior inferior cerebellar artery. 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 Nonvisualized anterior inferior cerebellar artery. Fetal type contribution to formation of the left posterior cerebral artery. Moderate to marked narrowing of portions of the posterior cerebral arteries bilaterally more notable on the right.   Carotid Doppler   There is 1-39% bilateral ICA stenosis. Vertebral artery flow is antegrade.    TTE - Left ventricle: The cavity size was normal. Wall thickness was increased in a pattern of mild LVH. There was mild focal basal hypertrophy of the septum. Systolic function was normal. The estimated ejection fraction was in the range of 55% to 60%. There was no dynamic obstruction at rest. Although no diagnostic regional wall motion abnormality was identified, this possibility cannot be completely excluded on the basis of this study. Doppler parameters are consistent with abnormal left ventricular relaxation (grade 1 diastolic dysfunction). Doppler parameters are consistent with elevated mean left atrial filling pressure. - Ventricular septum: Septal motion showed abnormal function, dyssynergy, and paradox. These changes are consistent with a left bundle branch block. - Mitral valve: Although there was no diagnostic evidence for systolic anterior motion, this possibility cannot be completely excluded on the basis of this study. There was moderate regurgitation directed eccentrically and posteriorly. - Left atrium: The atrium was mildly dilated.  PHYSICAL EXAM  Temp:  [97.5 F (36.4 C)-97.9 F (36.6 C)] 97.9 F (36.6 C) (03/02 1825) Pulse Rate:  [50-112] 112 (03/02 1825) Resp:  [15-16] 15 (03/02 1825) BP: (124-158)/(47-65) 124/47 mmHg (03/02 1825) SpO2:  [94 %-100 %] 95 % (03/02 1825) Weight:  [123 lb 8 oz (56.019 kg)] 123 lb 8 oz (56.019 kg) (03/02  0500)  General - Thin built, well developed, in no apparent distress.  Ophthalmologic - Fundi not visualized due to noncooperation.  Cardiovascular - Regular rate and rhythm.  Mental Status -  Level of arousal and orientation to time, place, and person were intact. Language including expression, naming, repetition, comprehension was assessed and found intact. Fund of Knowledge was assessed and was intact.  Cranial Nerves II - XII - II - Visual field intact OU. III, IV, VI - Extraocular movements intact V - Facial sensation intact bilaterally. VII - mild left nasolabial fold flattening. VIII - Hearing & vestibular intact bilaterally. X - Palate elevates symmetrically, subtle dysarthria. XI - Chin turning & shoulder shrug intact bilaterally. XII - Tongue protrusion intact.  Motor Strength - The patient's strength was normal in all extremities and pronator drift was absent.  Bulk was normal and fasciculations were absent.   Motor Tone - Muscle tone was assessed at the neck and appendages and was normal.  Reflexes - The patient's reflexes were 1+ in all extremities and she had no pathological reflexes.  Sensory - Light touch, temperature/pinprick were assessed and were symmetrical.    Coordination - The patient had  normal movements in the hands with no ataxia or dysmetria.  Tremor was absent.  Gait and Station - walk with walker in the hallway, slow, mild shuffling gait.   ASSESSMENT/PLAN Ms. Jennifer Pittman is a 82 y.o. female with history of previous stroke, hypertension, hypothyroidism, left bundle branch block, chronic kidney disease stage IV, and pernicious anemia presenting with 1 day of slurred speech and left facial droop. She did not receive IV t-PA due to delay in arrival.   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 Given stroke on aspirin, change to plavix 75 mg daily.  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 Please call with questions. Pt will follow up with Darrol Angel NP at [facility] in about 1 month. Thanks for the consult.  Marvel Plan, MD PhD Stroke Neurology 10/27/2015 9:09 PM    To contact Stroke Continuity provider, please refer to [email or web address]. After hours, contact General Neurology

## 2015-10-27 NOTE — Progress Notes (Signed)
*  PRELIMINARY RESULTS* Vascular Ultrasound Carotid Duplex (Doppler) has been completed.   There is no obvious evidence of hemodynamically significant internal carotid artery stenosis bilaterally. Vertebral arteries are patent with antegrade flow.  10/27/2015 8:50 AM Gertie Fey, RVT, RDCS, RDMS

## 2015-10-27 NOTE — Evaluation (Addendum)
Physical Therapy Evaluation Patient Details Name: Jennifer Pittman MRN: [id number] DOB: [birth year] Today's Date: 10/27/2015   History of Present Illness  His functional at baseline with history of hypertension, hypothyroidism, left bundle branch block, chronic kidney disease stage IV, pernicious anemia presented to the ED with a one-day history of slurred speech/dysarthria with an expressive aphasia and slight left facial droop  MRI findings Acute nonhemorrhagic small to moderate-size infarct posterior right lenticular nucleus extending to posterior right corona radiata with involvement of the right external capsule.  Clinical Impression  Patient presents with decreased independence with mobility due to deficits listed in PT problem list below.  She will benefit from skilled PT in the acute setting to allow return home with family support and HHPT.    Follow Up Recommendations Home health PT;Supervision/Assistance - 24 hour    Equipment Recommendations  None recommended by PT    Recommendations for Other Services       Precautions / Restrictions Precautions Precautions: Fall      Mobility  Bed Mobility Overal bed mobility: Needs Assistance Bed Mobility: Supine to Sit     Supine to sit: Supervision;HOB elevated     General bed mobility comments: increased time, assist for safety  Transfers Overall transfer level: Needs assistance Equipment used: Rolling walker (2 wheeled) Transfers: Sit to/from Stand Sit to Stand: Min guard         General transfer comment: iniitally unsteady in standing   Ambulation/Gait Ambulation/Gait assistance: Supervision;Min guard Ambulation Distance (Feet): 150 Feet Assistive device: Rolling walker (2 wheeled) Gait Pattern/deviations: Step-through pattern;Decreased stride length     General Gait Details: slower speed and increased time on turns  Information systems manager Rankin (Stroke Patients  Only) Modified Rankin (Stroke Patients Only) Pre-Morbid Rankin Score: Moderate disability Modified Rankin: Moderately severe disability     Balance Overall balance assessment: Needs assistance         Standing balance support: Bilateral upper extremity supported Standing balance-Leahy Scale: Poor Standing balance comment: minguard for balance initially standing with walker; had sedative for MRI and just back from testing                             Pertinent Vitals/Pain Pain Assessment: No/denies pain    Home Living Family/patient expects to be discharged to:: Private residence Living Arrangements: Children Available Help at Discharge: Family;Available 24 hours/day Type of Home: House Home Access: Stairs to enter Entrance Stairs-Rails: Right Entrance Stairs-Number of Steps: 3 Home Layout: One level Home Equipment: Walker - 2 wheels;Shower seat;Grab bars - tub/shower;Hand held shower head      Prior Function Level of Independence: Independent with assistive device(s)         Comments: doesn't shower unless family home and knows she is showering     Hand Dominance   Dominant Hand: Right    Extremity/Trunk Assessment               Lower Extremity Assessment: RLE deficits/detail;LLE deficits/detail RLE Deficits / Details: AROM WFL, strength hip flexion 4/5, knee extension 4/5, ankle DF 4+/5 LLE Deficits / Details: AROM WFL, strength hip flexion 4/5, knee extension 4/5, ankle DF 4+/5  Cervical / Trunk Assessment: Kyphotic  Communication   Communication: Expressive difficulties (mild dysarthria)  Cognition Arousal/Alertness: Awake/alert Behavior During Therapy: WFL for tasks assessed/performed Overall Cognitive Status: Within Functional Limits for tasks assessed  General Comments General comments (skin integrity, edema, etc.): son and daughter in law in room and very supportive and able to assist    Exercises         Assessment/Plan    PT Assessment Patient needs continued PT services  PT Diagnosis Generalized weakness   PT Problem List Decreased strength;Decreased balance;Decreased mobility;Decreased safety awareness  PT Treatment Interventions DME instruction;Balance training;Gait training;Functional mobility training;Patient/family education;Therapeutic activities;Therapeutic exercise   PT Goals (Current goals can be found in the Care Plan section) Acute Rehab PT Goals Patient Stated Goal: To go home PT Goal Formulation: With patient/family Time For Goal Achievement: 11/03/15 Potential to Achieve Goals: Good    Frequency Min 4X/week   Barriers to discharge        Co-evaluation               End of Session Equipment Utilized During Treatment: Gait belt Activity Tolerance: Patient tolerated treatment well Patient left: in chair;with call bell/phone within reach;with family/visitor present      Functional Assessment Tool Used: Clinical Judgement Functional Limitation: Mobility: Walking and moving around Mobility: Walking and Moving Around Current Status [phone]): At least 20 percent but less than 40 percent impaired, limited or restricted Mobility: Walking and Moving Around Goal Status [phone]): At least 1 percent but less than 20 percent impaired, limited or restricted    Time: 8295-6213 PT Time Calculation (min) (ACUTE ONLY): 33 min   Charges:   PT Evaluation $PT Eval Moderate Complexity: 1 Procedure PT Treatments $Gait Training: 8-22 mins   PT G Codes:   PT G-Codes **NOT FOR INPATIENT CLASS** Functional Assessment Tool Used: Clinical Judgement Functional Limitation: Mobility: Walking and moving around Mobility: Walking and Moving Around Current Status (Y8657): At least 20 percent but less than 40 percent impaired, limited or restricted Mobility: Walking and Moving Around Goal Status [phone]): At least 1 percent but less than 20 percent impaired, limited or restricted     Jennifer Pittman 10/27/2015, 1:37 PM Jennifer Pittman, Jennifer Pittman [phone] 10/27/2015

## 2015-10-27 NOTE — Care Management Note (Signed)
Case Management Note  Patient Details  Name: Jennifer Pittman MRN: [id number] Date of Birth: 10-12-1917  Subjective/Objective:                    Action/Plan: Patient presented with slurred speech. Lives at home with family.  Will follow for discharge needs.  Expected Discharge Date:                  Expected Discharge Plan:     In-House Referral:     Discharge planning Services     Post Acute Care Choice:    Choice offered to:     DME Arranged:    DME Agency:     HH Arranged:    HH Agency:     Status of Service:  In process, will continue to follow  Medicare Important Message Given:    Date Medicare IM Given:    Medicare IM give by:    Date Additional Medicare IM Given:    Additional Medicare Important Message give by:     If discussed at Long Length of Stay Meetings, dates discussed:    Additional CommentsAnda Kraft, RN 10/27/2015, 10:22 AM [phone]

## 2015-10-27 NOTE — Evaluation (Signed)
Occupational Therapy Evaluation Patient Details Name: Jennifer Pittman MRN: [id number] DOB: 15-Dec-1917 Today's Date: 10/27/2015    History of Present Illness Functional at baseline with history of hypertension, hypothyroidism, left bundle branch block, chronic kidney disease stage IV, pernicious anemia presented to the ED with a one-day history of slurred speech/dysarthria with an expressive aphasia and slight left facial droop  MRI findings Acute nonhemorrhagic small to moderate-size infarct posterior right lenticular nucleus extending to posterior right corona radiata with involvement of the right external capsule.   Clinical Impression   Pt reports she was independent with ADLs PTA. Currently pt overall min guard for safety with ADLs and functional mobility. Educated pt and daughter in law on home safety, supervision for tub transfers; they verbalize understanding. Pt planning to d/c home with 24/7 supervision from family. Pt would benefit from continued skilled OT to address established goals.    Follow Up Recommendations  No OT follow up;Supervision/Assistance - 24 hour    Equipment Recommendations  None recommended by OT    Recommendations for Other Services       Precautions / Restrictions Precautions Precautions: Fall Restrictions Weight Bearing Restrictions: No      Mobility Bed Mobility Overal bed mobility: Needs Assistance Bed Mobility: Sit to Supine     Supine to sit: Supervision;HOB elevated Sit to supine: Supervision   General bed mobility comments: Increased time. Supervision for safety  Transfers Overall transfer level: Needs assistance Equipment used: Rolling walker (2 wheeled) Transfers: Sit to/from Stand Sit to Stand: Min guard         General transfer comment: Increased time required. Sit to stand from chair x 1. Good hand placement and technique.    Balance Overall balance assessment: Needs assistance Sitting-balance support: No upper extremity  supported;Feet supported Sitting balance-Leahy Scale: Fair     Standing balance support: Bilateral upper extremity supported Standing balance-Leahy Scale: Poor Standing balance comment: RW for support                            ADL Overall ADL's : Needs assistance/impaired Eating/Feeding: Set up;Sitting   Grooming: Set up;Sitting   Upper Body Bathing: Supervision/ safety;Sitting   Lower Body Bathing: Min guard;Sit to/from stand   Upper Body Dressing : Supervision/safety;Sitting Upper Body Dressing Details (indicate cue type and reason): Pt able to [facility] hospital gown with supervision for safety Lower Body Dressing: Min guard;Sit to/from stand Lower Body Dressing Details (indicate cue type and reason): Pt able to pull up socks prior to functional mobility and doff sitting EOB. Toilet Transfer: Min guard;Ambulation;Regular Toilet;RW   Toileting- Architect and Hygiene: Min guard;Sit to/from stand       Functional mobility during ADLs: Min guard;Rolling walker General ADL Comments: Pts son and daughter in law present for OT eval. Educated pt and daughter in law on home safety, supervision for tub transfers, someone home when pt performs bathing; pt reports she already does all of these things and will continue to upon return home.     Vision Vision Assessment?: No apparent visual deficits   Perception     Praxis      Pertinent Vitals/Pain Pain Assessment: No/denies pain     Hand Dominance Right   Extremity/Trunk Assessment Upper Extremity Assessment Upper Extremity Assessment: Generalized weakness   Lower Extremity Assessment Lower Extremity Assessment: Defer to PT evaluation RLE Deficits / Details: AROM WFL, strength hip flexion 4/5, knee extension 4/5, ankle DF 4+/5 LLE Deficits /  Details: AROM WFL, strength hip flexion 4/5, knee extension 4/5, ankle DF 4+/5   Cervical / Trunk Assessment Cervical / Trunk Assessment: Kyphotic    Communication Communication Communication: Expressive difficulties (mild dysarthria)   Cognition Arousal/Alertness: Awake/alert Behavior During Therapy: WFL for tasks assessed/performed Overall Cognitive Status: Within Functional Limits for tasks assessed                     General Comments       Exercises       Shoulder Instructions      Home Living Family/patient expects to be discharged to:: Private residence Living Arrangements: Children Available Help at Discharge: Family;Available 24 hours/day Type of Home: House Home Access: Stairs to enter [employer] of Steps: 3 Entrance Stairs-Rails: Right Home Layout: One level     Bathroom Shower/Tub: Tub/shower unit;Curtain Shower/tub characteristics: Engineer, building services: Standard Bathroom Accessibility: Yes How Accessible: Accessible via walker Home Equipment: Walker - 2 wheels;Shower seat;Grab bars - tub/shower;Hand held shower head          Prior Functioning/Environment Level of Independence: Independent with assistive device(s)        Comments: doesn't shower unless family home and knows she is showering    OT Diagnosis: Generalized weakness   OT Problem List: Decreased strength;Impaired balance (sitting and/or standing);Decreased knowledge of use of DME or AE;Decreased knowledge of precautions   OT Treatment/Interventions: Self-care/ADL training;Therapeutic exercise;Energy conservation;DME and/or AE instruction;Therapeutic activities;Patient/family education;Balance training    OT Goals(Current goals can be found in the care plan section) Acute Rehab OT Goals Patient Stated Goal: To go home OT Goal Formulation: With patient/family Time For Goal Achievement: 11/10/15 Potential to Achieve Goals: Good ADL Goals Pt Will Perform Grooming: with supervision;standing Pt Will Perform Upper Body Bathing: with modified independence;sitting Pt Will Perform Lower Body Bathing: with  supervision;sit to/from stand Pt Will Transfer to Toilet: with supervision;ambulating Pt Will Perform Toileting - Clothing Manipulation and hygiene: with supervision;sit to/from stand Pt Will Perform Tub/Shower Transfer: Tub transfer;with supervision;ambulating;shower seat;rolling walker Pt/caregiver will Perform Home Exercise Program: Increased strength;Both right and left upper extremity;With theraband;Independently  OT Frequency: Min 2X/week   Barriers to D/C:            Co-evaluation              End of Session Equipment Utilized During Treatment: Gait belt;Rolling walker Nurse Communication: Mobility status  Activity Tolerance: Patient tolerated treatment well Patient left: in bed;with call bell/phone within reach;with bed alarm set;with family/visitor present   Time: 1610-9604 OT Time Calculation (min): 17 min Charges:  OT General Charges $OT Visit: 1 Procedure OT Evaluation $OT Eval Moderate Complexity: 1 Procedure G-Codes: OT G-codes **NOT FOR INPATIENT CLASS** Functional Assessment Tool Used: Clinical judgement Functional Limitation: Self care Self Care Current Status (V4098): At least 1 percent but less than 20 percent impaired, limited or restricted Self Care Goal Status (J1914): At least 1 percent but less than 20 percent impaired, limited or restricted   Gaye Alken M.S., OTR/L Pager: [phone]  10/27/2015, 2:01 PM

## 2015-10-27 NOTE — Progress Notes (Signed)
  Echocardiogram 2D Echocardiogram has been performed.  Tye Savoy 10/27/2015, 3:55 PM

## 2015-10-27 NOTE — Progress Notes (Signed)
[facility] PROGRESS NOTE  Jennifer Pittman YQM:[id number] DOB: 1918/08/03 DOA: 10/26/2015 PCP: Georgianne Fick, MD  Assessment/Plan: Principal Problem:   Stroke (cerebrum) (HCC) - Currently undergoing work up - Continue Plavix - Neurology on board    HTN (hypertension) - on amlodipine, Avapro    Pernicious anemia -Continue to monitor hemoglobin levels -No active bleeding reported to me    Osteoarthritis   E. coli UTI: 10/19/2015 at PCP office - continue Rocephin    Hypothyroidism - continue synthroid    Hyperlipidemia - continue statin    CKD (chronic kidney disease), stage IV (HCC) - May be contributing to elevated Serum creatinine    Elevated troponin - no chest pain reported - awaiting echocardiogram report  Code Status: DNR Family Communication: None at bedside Disposition Plan: pending further workup   Consultants:  Neurology  Procedures:  Please refer to EMR  Antibiotics:  Rocephin  HPI/Subjective: Pt has no new complaints  Objective: Filed Vitals:   10/27/15 0953 10/27/15 1412  BP: 145/65 133/53  Pulse: 75 64  Temp: 97.7 F (36.5 C) 97.6 F (36.4 C)  Resp: 15 15   No intake or output data in the 24 hours ending 10/27/15 1736 Filed Weights   10/27/15 0500  Weight: 56.019 kg (123 lb 8 oz)    Exam:   General:  Pt in nad, alert and awake  Cardiovascular: rrr, no rubs  Respiratory: no increased wob, no wheezes  Abdomen: soft, nt, nd  Musculoskeletal: no cyanosis or clubbing   Data Reviewed: Basic Metabolic Panel:  Recent Labs Lab 10/26/15 1520 10/26/15 1537 10/26/15 2010 10/27/15 0639  NA 139 137  --  138  K 3.9 3.8  --  3.4*  CL 105 105  --  106  CO2 23  --   --  24  GLUCOSE 109* 104*  --  96  BUN 34* 36*  --  30*  CREATININE 1.69* 1.70*  --  1.58*  CALCIUM 9.5  --   --  8.9  MG  --   --  1.7 2.7*   Liver Function Tests:  Recent Labs Lab 10/26/15 1520  AST 20  ALT 11*  ALKPHOS 46  BILITOT 0.7  PROT  6.2*  ALBUMIN 3.5   No results for input(s): LIPASE, AMYLASE in the last 168 hours. No results for input(s): AMMONIA in the last 168 hours. CBC:  Recent Labs Lab 10/26/15 1520 10/26/15 1537 10/27/15 0639  WBC 9.6  --  7.3  NEUTROABS 6.3  --   --   HGB 11.3* 12.6 10.3*  HCT 35.1* 37.0 30.4*  MCV 94.4  --  93.8  PLT 200  --  181   Cardiac Enzymes:  Recent Labs Lab 10/27/15 0107 10/27/15 0639 10/27/15 1330  TROPONINI 0.15* 0.19* 0.16*   BNP (last 3 results) No results for input(s): BNP in the last 8760 hours.  ProBNP (last 3 results) No results for input(s): PROBNP in the last 8760 hours.  CBG: No results for input(s): GLUCAP in the last 168 hours.  No results found for this or any previous visit (from the past 240 hour(s)).   Studies: Dg Chest 2 View  10/26/2015  CLINICAL DATA:  82 year old female with history of facial droop and weakness. Possible stroke-like symptoms. No chest complaints. EXAM: CHEST  2 VIEW COMPARISON:  No priors. FINDINGS: No acute consolidative airspace disease. No pleural effusions. Mild diffuse interstitial prominence, likely chronic (no prior studies are available for comparison). No pneumothorax. No suspicious appearing pulmonary  nodules or masses. No evidence of pulmonary edema. Heart size is upper limits of normal. Atherosclerotic calcifications throughout the thoracic aorta. Upper mediastinal contours are within normal limits. IMPRESSION: 1. No radiographic evidence of acute cardiopulmonary disease. 2. Atherosclerosis. Electronically Signed   By: Trudie Reed M.D.   On: 10/26/2015 20:01   Ct Head Wo Contrast  10/26/2015  CLINICAL DATA:  Left facial droop today.  Initial encounter. EXAM: CT HEAD WITHOUT CONTRAST TECHNIQUE: Contiguous axial images were obtained from the base of the skull through the vertex without intravenous contrast. COMPARISON:  None. FINDINGS: There is cortical atrophy and chronic microvascular ischemic change. No evidence of  acute intracranial abnormality including hemorrhage, infarct, midline shift or abnormal extra-axial fluid collection is identified. No hydrocephalus or pneumocephalus. The calvarium is intact. Imaged paranasal sinuses and mastoid air cells are clear. IMPRESSION: No acute abnormality. Mild atrophy and chronic microvascular ischemic change. Electronically Signed   By: Drusilla Kanner M.D.   On: 10/26/2015 16:07   Mr Brain Wo Contrast  10/27/2015  CLINICAL DATA:  82 year old hypertensive female with slurred speech. Left facial droop. Subsequent encounter. EXAM: MRI HEAD WITHOUT CONTRAST MRA HEAD WITHOUT CONTRAST TECHNIQUE: Multiplanar, multiecho pulse sequences of the brain and surrounding structures were obtained without intravenous contrast. Angiographic images of the head were obtained using MRA technique without contrast. COMPARISON:  10/26/2015 CT. FINDINGS: MRI HEAD FINDINGS Acute nonhemorrhagic small to moderate-size infarct posterior right lenticular nucleus extending to posterior right corona radiata with involvement of the right external capsule. No intracranial hemorrhage. Very mild small vessel disease changes. Mild global atrophy without hydrocephalus. No intracranial mass lesion noted on this unenhanced exam. Mild prominence transverse ligament. Cerebellar tonsils minimally low lying but within the range of normal limits. Post lens replacement otherwise orbital structures unremarkable. Pituitary region within normal limits. MRA HEAD FINDINGS Mild to moderate narrowing supraclinoid aspect of the internal carotid artery bilaterally. Left posterior communicating artery 3 mm bulge suggestive of aneurysm rather than prominent infundibulum Hypoplastic A1 segment right anterior cerebral artery moderate to marked narrowing. Mild narrowing M1 segment right middle cerebral artery. Moderate narrowing M1 segment left middle cerebral artery. Middle cerebral artery moderate to marked branch vessel narrowing and  irregularity bilaterally. A2 segment anterior cerebral artery mild to slightly moderate narrowing more notable on the right. Right vertebral artery ends in a posterior inferior cerebellar artery distribution with moderate to marked narrowing at the junction of the vertebral artery and formation of the right posterior inferior cerebellar artery. Moderate to marked narrowing distal left vertebral artery. Poor delineation of the left posterior inferior cerebellar artery. 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 Nonvisualized anterior inferior cerebellar artery. Fetal type contribution to formation of the left posterior cerebral artery. Moderate to marked narrowing of portions of the posterior cerebral arteries bilaterally more notable on the right. Slight bulge basilar tip on the left most likely related to the fetal contribution to the left posterior cerebral artery rather than saccular aneurysm. IMPRESSION: MRI HEAD Acute nonhemorrhagic small to moderate-size infarct posterior right lenticular nucleus extending to posterior right corona radiata with involvement of the right external capsule. No intracranial hemorrhage. Very mild small vessel disease changes. Mild global atrophy without hydrocephalus. MRA HEAD Mild to moderate narrowing supraclinoid aspect of the internal carotid artery bilaterally. Left posterior communicating artery 3 mm bulge suggestive of aneurysm rather than prominent infundibulum Hypoplastic A1 segment right anterior cerebral artery moderate to marked narrowing. Mild narrowing M1 segment right middle cerebral artery. Moderate narrowing M1 segment left  middle cerebral artery. Middle cerebral artery moderate to marked branch vessel narrowing and irregularity bilaterally. A2 segment anterior cerebral artery mild to slightly moderate narrowing more notable on the right. Right vertebral artery ends in a posterior inferior cerebellar artery distribution with moderate to marked narrowing at  the junction of the right vertebral artery and formation of the right posterior inferior cerebellar artery. Moderate to marked narrowing distal left vertebral artery. Poor delineation of the left posterior inferior cerebellar artery. 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 Nonvisualized anterior inferior cerebellar artery. Fetal type contribution to formation of the left posterior cerebral artery. Moderate to marked narrowing of portions of the posterior cerebral arteries bilaterally more notable on the right. Electronically Signed   By: Lacy Duverney M.D.   On: 10/27/2015 11:45   Mr Maxine Glenn Head/brain Wo Cm  10/27/2015  CLINICAL DATA:  82 year old hypertensive female with slurred speech. Left facial droop. Subsequent encounter. EXAM: MRI HEAD WITHOUT CONTRAST MRA HEAD WITHOUT CONTRAST TECHNIQUE: Multiplanar, multiecho pulse sequences of the brain and surrounding structures were obtained without intravenous contrast. Angiographic images of the head were obtained using MRA technique without contrast. COMPARISON:  10/26/2015 CT. FINDINGS: MRI HEAD FINDINGS Acute nonhemorrhagic small to moderate-size infarct posterior right lenticular nucleus extending to posterior right corona radiata with involvement of the right external capsule. No intracranial hemorrhage. Very mild small vessel disease changes. Mild global atrophy without hydrocephalus. No intracranial mass lesion noted on this unenhanced exam. Mild prominence transverse ligament. Cerebellar tonsils minimally low lying but within the range of normal limits. Post lens replacement otherwise orbital structures unremarkable. Pituitary region within normal limits. MRA HEAD FINDINGS Mild to moderate narrowing supraclinoid aspect of the internal carotid artery bilaterally. Left posterior communicating artery 3 mm bulge suggestive of aneurysm rather than prominent infundibulum Hypoplastic A1 segment right anterior cerebral artery moderate to marked narrowing.  Mild narrowing M1 segment right middle cerebral artery. Moderate narrowing M1 segment left middle cerebral artery. Middle cerebral artery moderate to marked branch vessel narrowing and irregularity bilaterally. A2 segment anterior cerebral artery mild to slightly moderate narrowing more notable on the right. Right vertebral artery ends in a posterior inferior cerebellar artery distribution with moderate to marked narrowing at the junction of the vertebral artery and formation of the right posterior inferior cerebellar artery. Moderate to marked narrowing distal left vertebral artery. Poor delineation of the left posterior inferior cerebellar artery. 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 Nonvisualized anterior inferior cerebellar artery. Fetal type contribution to formation of the left posterior cerebral artery. Moderate to marked narrowing of portions of the posterior cerebral arteries bilaterally more notable on the right. Slight bulge basilar tip on the left most likely related to the fetal contribution to the left posterior cerebral artery rather than saccular aneurysm. IMPRESSION: MRI HEAD Acute nonhemorrhagic small to moderate-size infarct posterior right lenticular nucleus extending to posterior right corona radiata with involvement of the right external capsule. No intracranial hemorrhage. Very mild small vessel disease changes. Mild global atrophy without hydrocephalus. MRA HEAD Mild to moderate narrowing supraclinoid aspect of the internal carotid artery bilaterally. Left posterior communicating artery 3 mm bulge suggestive of aneurysm rather than prominent infundibulum Hypoplastic A1 segment right anterior cerebral artery moderate to marked narrowing. Mild narrowing M1 segment right middle cerebral artery. Moderate narrowing M1 segment left middle cerebral artery. Middle cerebral artery moderate to marked branch vessel narrowing and irregularity bilaterally. A2 segment anterior cerebral  artery mild to slightly moderate narrowing more notable on the right. Right vertebral  artery ends in a posterior inferior cerebellar artery distribution with moderate to marked narrowing at the junction of the right vertebral artery and formation of the right posterior inferior cerebellar artery. Moderate to marked narrowing distal left vertebral artery. Poor delineation of the left posterior inferior cerebellar artery. 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 Nonvisualized anterior inferior cerebellar artery. Fetal type contribution to formation of the left posterior cerebral artery. Moderate to marked narrowing of portions of the posterior cerebral arteries bilaterally more notable on the right. Electronically Signed   By: Lacy Duverney M.D.   On: 10/27/2015 11:45    Scheduled Meds: . amLODipine  10 mg Oral Daily  . atorvastatin  10 mg Oral q1800  . cefTRIAXone (ROCEPHIN)  IV  1 g Intravenous Q24H  . cholecalciferol  2,000 Units Oral Daily  . [START ON 10/28/2015] clopidogrel  75 mg Oral Daily  . dorzolamide-timolol  1 drop Both Eyes BID  . heparin  5,000 Units Subcutaneous 3 times per day  . irbesartan  75 mg Oral Daily  . latanoprost  1 drop Both Eyes QHS  . levothyroxine  112 mcg Oral QAC breakfast  . multivitamin with minerals  1 tablet Oral Daily  . senna-docusate  1 tablet Oral QHS   Continuous Infusions: . sodium chloride 75 mL/hr at 10/26/15 1810    Time spent: > 35 minutes   [name]  [facility] Pager [phone] If 7PM-7AM, please contact night-coverage at [email or web address], password [facility] 10/27/2015, 5:36 PM

## 2015-10-27 NOTE — Progress Notes (Signed)
OT Cancellation Note  Patient Details Name: Jennifer Pittman MRN: [id number] DOB: April 04, 1918   Cancelled Treatment:    Reason Eval/Treat Not Completed: Patient at procedure or test/ unavailable (MRI). Will follow up for OT eval as time allows.  Gaye Alken M.S., OTR/L Pager: [phone]  10/27/2015, 10:50 AM

## 2015-10-28 LAB — CBC
HEMATOCRIT: 34.6 % — AB (ref 36.0–46.0)
Hemoglobin: 11.6 g/dL — ABNORMAL LOW (ref 12.0–15.0)
MCH: 31.7 pg (ref 26.0–34.0)
MCHC: 33.5 g/dL (ref 30.0–36.0)
MCV: 94.5 fL (ref 78.0–100.0)
Platelets: 208 10*3/uL (ref 150–400)
RBC: 3.66 MIL/uL — ABNORMAL LOW (ref 3.87–5.11)
RDW: 13.4 % (ref 11.5–15.5)
WBC: 8.8 10*3/uL (ref 4.0–10.5)

## 2015-10-28 LAB — HEMOGLOBIN A1C
HEMOGLOBIN A1C: 5.8 % — AB (ref 4.8–5.6)
MEAN PLASMA GLUCOSE: 120 mg/dL

## 2015-10-28 LAB — TROPONIN I: TROPONIN I: 0.14 ng/mL — AB (ref <0.031)

## 2015-10-28 NOTE — Progress Notes (Signed)
Physical Therapy Treatment Patient Details Name: Jennifer Pittman MRN: [id number] DOB: 1917-09-24 Today's Date: 10/28/2015    History of Present Illness Functional at baseline with history of hypertension, hypothyroidism, left bundle branch block, chronic kidney disease stage IV, pernicious anemia presented to the ED with a one-day history of slurred speech/dysarthria with an expressive aphasia and slight left facial droop  MRI findings Acute nonhemorrhagic small to moderate-size infarct posterior right lenticular nucleus extending to posterior right corona radiata with involvement of the right external capsule.    PT Comments    Patient very appreciative and eager to move more "so I don't get any weaker." Both her and daughter feel her mobility and strength are nearly back to baseline (? If remaining decr strength/confidence related to limited mobility while hospitalized). Encouraged patient to walk again today with nursing assist.   Follow Up Recommendations  Home health PT;Supervision/Assistance - 24 hour     Equipment Recommendations  None recommended by PT    Recommendations for Other Services       Precautions / Restrictions Precautions Precautions: Fall Restrictions Weight Bearing Restrictions: No    Mobility  Bed Mobility Overal bed mobility: Modified Independent Bed Mobility: Supine to Sit     Supine to sit: Modified independent (Device/Increase time)     General bed mobility comments: HOB slightly elevated  Transfers Overall transfer level: Modified independent Equipment used: Rolling walker (2 wheeled) Transfers: Sit to/from Stand Sit to Stand: Modified independent (Device/Increase time)         General transfer comment: Good technique x 2 trials  Ambulation/Gait Ambulation/Gait assistance: Min guard Ambulation Distance (Feet): 150 Feet Assistive device: Rolling walker (2 wheeled) Gait Pattern/deviations: Step-through pattern;Decreased stride length;Trunk  flexed   Gait velocity interpretation: at or above normal speed for age/gender General Gait Details: pushes RW away from her body; notes she has a tray between her handles on her RW at home, therefore she cannot "step up inside" RW   Stairs            Wheelchair Mobility    Modified Rankin (Stroke Patients Only) Modified Rankin (Stroke Patients Only) Pre-Morbid Rankin Score: Moderate disability Modified Rankin: Moderately severe disability     Balance     Sitting balance-Leahy Scale: Fair       Standing balance-Leahy Scale: Poor                      Cognition Arousal/Alertness: Awake/alert Behavior During Therapy: WFL for tasks assessed/performed Overall Cognitive Status: Within Functional Limits for tasks assessed                      Exercises      General Comments General comments (skin integrity, edema, etc.): daughter present and agrees her mobility/strength are nearly back to normal, however reports her speech is not      Pertinent Vitals/Pain Pain Assessment: No/denies pain    Home Living     Available Help at Discharge: Family;Available 24 hours/day Type of Home: House              Prior Function            PT Goals (current goals can now be found in the care plan section) Acute Rehab PT Goals Patient Stated Goal: To go home Time For Goal Achievement: 11/03/15 Progress towards PT goals: Progressing toward goals    Frequency  Min 4X/week    PT Plan Current plan remains appropriate  Co-evaluation             End of Session Equipment Utilized During Treatment: Gait belt Activity Tolerance: Patient tolerated treatment well Patient left: in chair;with call bell/phone within reach;with family/visitor present;with chair alarm set     Time: [id number] PT Time Calculation (min) (ACUTE ONLY): 24 min  Charges:  $Gait Training: 23-37 mins                    G Codes:      Pedram Goodchild 10/28/2015, 2:26 PM  Pager  [phone]

## 2015-10-28 NOTE — Progress Notes (Signed)
[facility] PROGRESS NOTE  Breindel Collier AVW:[id number] DOB: [birth year] DOA: 10/26/2015 PCP: Georgianne Fick, MD  Assessment/Plan: Principal Problem:   Stroke (cerebrum) (HCC) - Currently undergoing work up - Continue Plavix - Neurology on board - PT recommends home health PT once ready for d/c    HTN (hypertension) - on amlodipine, Avapro    Pernicious anemia -Continue to monitor hemoglobin levels -No active bleeding reported to me    Osteoarthritis   E. coli UTI: 10/19/2015 at PCP office - continue Rocephin    Hypothyroidism - continue synthroid    Hyperlipidemia - continue statin    CKD (chronic kidney disease), stage IV [facility]) - May be contributing to elevated Serum creatinine    Elevated troponin - no chest pain reported - awaiting echocardiogram report  Code Status: DNR Family Communication: None at bedside Disposition Plan: pending further workup   Consultants:  Neurology  Procedures:  Please refer to EMR  Antibiotics:  Rocephin  HPI/Subjective: Pt has no new complaints, no acute issues overnight.  Objective: Filed Vitals:   10/28/15 1020 10/28/15 1443  BP: 123/49 118/50  Pulse: 68 70  Temp: 98.3 F (36.8 C) 98 F (36.7 C)  Resp: 18 18   No intake or output data in the 24 hours ending 10/28/15 1712 Filed Weights   10/27/15 0500  Weight: 56.019 kg (123 lb 8 oz)    Exam:   General:  Pt in nad, alert and awake  Cardiovascular: rrr, no rubs  Respiratory: no increased wob, no wheezes  Abdomen: soft, nt, nd  Musculoskeletal: no cyanosis or clubbing   Data Reviewed: Basic Metabolic Panel:  Recent Labs Lab 10/26/15 1520 10/26/15 1537 10/26/15 2010 10/27/15 0639  NA 139 137  --  138  K 3.9 3.8  --  3.4*  CL 105 105  --  106  CO2 23  --   --  24  GLUCOSE 109* 104*  --  96  BUN 34* 36*  --  30*  CREATININE 1.69* 1.70*  --  1.58*  CALCIUM 9.5  --   --  8.9  MG  --   --  1.7 2.7*   Liver Function Tests:  Recent  Labs Lab 10/26/15 1520  AST 20  ALT 11*  ALKPHOS 46  BILITOT 0.7  PROT 6.2*  ALBUMIN 3.5   No results for input(s): LIPASE, AMYLASE in the last 168 hours. No results for input(s): AMMONIA in the last 168 hours. CBC:  Recent Labs Lab 10/26/15 1520 10/26/15 1537 10/27/15 0639 10/28/15 0801  WBC 9.6  --  7.3 8.8  NEUTROABS 6.3  --   --   --   HGB 11.3* 12.6 10.3* 11.6*  HCT 35.1* 37.0 30.4* 34.6*  MCV 94.4  --  93.8 94.5  PLT 200  --  181 208   Cardiac Enzymes:  Recent Labs Lab 10/27/15 0107 10/27/15 0639 10/27/15 1330 10/28/15 0801  TROPONINI 0.15* 0.19* 0.16* 0.14*   BNP (last 3 results) No results for input(s): BNP in the last 8760 hours.  ProBNP (last 3 results) No results for input(s): PROBNP in the last 8760 hours.  CBG: No results for input(s): GLUCAP in the last 168 hours.  No results found for this or any previous visit (from the past 240 hour(s)).   Studies: Dg Chest 2 View  10/26/2015  CLINICAL DATA:  82 year old female with history of facial droop and weakness. Possible stroke-like symptoms. No chest complaints. EXAM: CHEST  2 VIEW COMPARISON:  No priors. FINDINGS:  No acute consolidative airspace disease. No pleural effusions. Mild diffuse interstitial prominence, likely chronic (no prior studies are available for comparison). No pneumothorax. No suspicious appearing pulmonary nodules or masses. No evidence of pulmonary edema. Heart size is upper limits of normal. Atherosclerotic calcifications throughout the thoracic aorta. Upper mediastinal contours are within normal limits. IMPRESSION: 1. No radiographic evidence of acute cardiopulmonary disease. 2. Atherosclerosis. Electronically Signed   By: Trudie Reedaniel  Entrikin M.D.   On: 10/26/2015 20:01   Mr Brain Wo Contrast  10/27/2015  CLINICAL DATA:  82 year old hypertensive female with slurred speech. Left facial droop. Subsequent encounter. EXAM: MRI HEAD WITHOUT CONTRAST MRA HEAD WITHOUT CONTRAST TECHNIQUE:  Multiplanar, multiecho pulse sequences of the brain and surrounding structures were obtained without intravenous contrast. Angiographic images of the head were obtained using MRA technique without contrast. COMPARISON:  10/26/2015 CT. FINDINGS: MRI HEAD FINDINGS Acute nonhemorrhagic small to moderate-size infarct posterior right lenticular nucleus extending to posterior right corona radiata with involvement of the right external capsule. No intracranial hemorrhage. Very mild small vessel disease changes. Mild global atrophy without hydrocephalus. No intracranial mass lesion noted on this unenhanced exam. Mild prominence transverse ligament. Cerebellar tonsils minimally low lying but within the range of normal limits. Post lens replacement otherwise orbital structures unremarkable. Pituitary region within normal limits. MRA HEAD FINDINGS Mild to moderate narrowing supraclinoid aspect of the internal carotid artery bilaterally. Left posterior communicating artery 3 mm bulge suggestive of aneurysm rather than prominent infundibulum Hypoplastic A1 segment right anterior cerebral artery moderate to marked narrowing. Mild narrowing M1 segment right middle cerebral artery. Moderate narrowing M1 segment left middle cerebral artery. Middle cerebral artery moderate to marked branch vessel narrowing and irregularity bilaterally. A2 segment anterior cerebral artery mild to slightly moderate narrowing more notable on the right. Right vertebral artery ends in a posterior inferior cerebellar artery distribution with moderate to marked narrowing at the junction of the vertebral artery and formation of the right posterior inferior cerebellar artery. Moderate to marked narrowing distal left vertebral artery. Poor delineation of the left posterior inferior cerebellar artery. 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 Nonvisualized anterior inferior cerebellar artery. Fetal type contribution to formation of the left posterior  cerebral artery. Moderate to marked narrowing of portions of the posterior cerebral arteries bilaterally more notable on the right. Slight bulge basilar tip on the left most likely related to the fetal contribution to the left posterior cerebral artery rather than saccular aneurysm. IMPRESSION: MRI HEAD Acute nonhemorrhagic small to moderate-size infarct posterior right lenticular nucleus extending to posterior right corona radiata with involvement of the right external capsule. No intracranial hemorrhage. Very mild small vessel disease changes. Mild global atrophy without hydrocephalus. MRA HEAD Mild to moderate narrowing supraclinoid aspect of the internal carotid artery bilaterally. Left posterior communicating artery 3 mm bulge suggestive of aneurysm rather than prominent infundibulum Hypoplastic A1 segment right anterior cerebral artery moderate to marked narrowing. Mild narrowing M1 segment right middle cerebral artery. Moderate narrowing M1 segment left middle cerebral artery. Middle cerebral artery moderate to marked branch vessel narrowing and irregularity bilaterally. A2 segment anterior cerebral artery mild to slightly moderate narrowing more notable on the right. Right vertebral artery ends in a posterior inferior cerebellar artery distribution with moderate to marked narrowing at the junction of the right vertebral artery and formation of the right posterior inferior cerebellar artery. Moderate to marked narrowing distal left vertebral artery. Poor delineation of the left posterior inferior cerebellar artery. 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 Nonvisualized  anterior inferior cerebellar artery. Fetal type contribution to formation of the left posterior cerebral artery. Moderate to marked narrowing of portions of the posterior cerebral arteries bilaterally more notable on the right. Electronically Signed   By: Lacy Duverney M.D.   On: 10/27/2015 11:45   Mr Maxine Glenn Head/brain Wo  Cm  10/27/2015  CLINICAL DATA:  82 year old hypertensive female with slurred speech. Left facial droop. Subsequent encounter. EXAM: MRI HEAD WITHOUT CONTRAST MRA HEAD WITHOUT CONTRAST TECHNIQUE: Multiplanar, multiecho pulse sequences of the brain and surrounding structures were obtained without intravenous contrast. Angiographic images of the head were obtained using MRA technique without contrast. COMPARISON:  10/26/2015 CT. FINDINGS: MRI HEAD FINDINGS Acute nonhemorrhagic small to moderate-size infarct posterior right lenticular nucleus extending to posterior right corona radiata with involvement of the right external capsule. No intracranial hemorrhage. Very mild small vessel disease changes. Mild global atrophy without hydrocephalus. No intracranial mass lesion noted on this unenhanced exam. Mild prominence transverse ligament. Cerebellar tonsils minimally low lying but within the range of normal limits. Post lens replacement otherwise orbital structures unremarkable. Pituitary region within normal limits. MRA HEAD FINDINGS Mild to moderate narrowing supraclinoid aspect of the internal carotid artery bilaterally. Left posterior communicating artery 3 mm bulge suggestive of aneurysm rather than prominent infundibulum Hypoplastic A1 segment right anterior cerebral artery moderate to marked narrowing. Mild narrowing M1 segment right middle cerebral artery. Moderate narrowing M1 segment left middle cerebral artery. Middle cerebral artery moderate to marked branch vessel narrowing and irregularity bilaterally. A2 segment anterior cerebral artery mild to slightly moderate narrowing more notable on the right. Right vertebral artery ends in a posterior inferior cerebellar artery distribution with moderate to marked narrowing at the junction of the vertebral artery and formation of the right posterior inferior cerebellar artery. Moderate to marked narrowing distal left vertebral artery. Poor delineation of the left  posterior inferior cerebellar artery. 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 Nonvisualized anterior inferior cerebellar artery. Fetal type contribution to formation of the left posterior cerebral artery. Moderate to marked narrowing of portions of the posterior cerebral arteries bilaterally more notable on the right. Slight bulge basilar tip on the left most likely related to the fetal contribution to the left posterior cerebral artery rather than saccular aneurysm. IMPRESSION: MRI HEAD Acute nonhemorrhagic small to moderate-size infarct posterior right lenticular nucleus extending to posterior right corona radiata with involvement of the right external capsule. No intracranial hemorrhage. Very mild small vessel disease changes. Mild global atrophy without hydrocephalus. MRA HEAD Mild to moderate narrowing supraclinoid aspect of the internal carotid artery bilaterally. Left posterior communicating artery 3 mm bulge suggestive of aneurysm rather than prominent infundibulum Hypoplastic A1 segment right anterior cerebral artery moderate to marked narrowing. Mild narrowing M1 segment right middle cerebral artery. Moderate narrowing M1 segment left middle cerebral artery. Middle cerebral artery moderate to marked branch vessel narrowing and irregularity bilaterally. A2 segment anterior cerebral artery mild to slightly moderate narrowing more notable on the right. Right vertebral artery ends in a posterior inferior cerebellar artery distribution with moderate to marked narrowing at the junction of the right vertebral artery and formation of the right posterior inferior cerebellar artery. Moderate to marked narrowing distal left vertebral artery. Poor delineation of the left posterior inferior cerebellar artery. 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 Nonvisualized anterior inferior cerebellar artery. Fetal type contribution to formation of the left posterior cerebral artery. Moderate to marked  narrowing of portions of the posterior cerebral arteries bilaterally more notable on the right.  Electronically Signed   By: Lacy Duverney M.D.   On: 10/27/2015 11:45    Scheduled Meds: . amLODipine  10 mg Oral Daily  . atorvastatin  10 mg Oral q1800  . cefTRIAXone (ROCEPHIN)  IV  1 g Intravenous Q24H  . cholecalciferol  2,000 Units Oral Daily  . clopidogrel  75 mg Oral Daily  . dorzolamide-timolol  1 drop Both Eyes BID  . heparin  5,000 Units Subcutaneous 3 times per day  . irbesartan  75 mg Oral Daily  . latanoprost  1 drop Both Eyes QHS  . levothyroxine  112 mcg Oral QAC breakfast  . multivitamin with minerals  1 tablet Oral Daily  . senna-docusate  1 tablet Oral QHS   Continuous Infusions: . sodium chloride 75 mL/hr at 10/26/15 1810    Time spent: > 35 minutes   [name]  [facility] Pager [phone] If 7PM-7AM, please contact night-coverage at [email or web address], password [facility] 10/28/2015, 5:12 PM  LOS: 1 day

## 2015-10-28 NOTE — Evaluation (Signed)
Speech Language Pathology Evaluation Patient Details Name: Jennifer Pittman MRN: [id number] DOB: 11/10/1917 Today's Date: 10/28/2015 Time: [id number] SLP Time Calculation (min) (ACUTE ONLY): 17 min  Problem List:  Patient Active Problem List   Diagnosis Date Noted  . Acute CVA (cerebrovascular accident) (HCC) 10/27/2015  . Stroke (cerebrum) (HCC) 10/26/2015  . Facial droop 10/26/2015  . HTN (hypertension) 10/26/2015  . BBB (bundle branch block) 10/26/2015  . Pernicious anemia 10/26/2015  . Osteoarthritis 10/26/2015  . E. coli UTI: 10/19/2015 at PCP office 10/26/2015  . B12 deficiency 10/26/2015  . Hypothyroidism 10/26/2015  . Hyperlipidemia 10/26/2015  . CKD (chronic kidney disease), stage IV (HCC) 10/26/2015  . Elevated troponin   . Slurred speech    Past Medical History:  Past Medical History  Diagnosis Date  . Hypertension   . Thyroid disease   . Bundle branch block, left   . Pernicious anemia   . Osteoarthritis   . B12 deficiency 10/26/2015  . Hypothyroidism 10/26/2015  . CKD (chronic kidney disease), stage IV (HCC) 10/26/2015   Past Surgical History:  Past Surgical History  Procedure Laterality Date  . Hyterectomy    . Abdominal hysterectomy    . Total hip arthroplasty Left    HPI:  82 y.o. female with history of previous stroke, hypertension, hypothyroidism, left bundle branch block, chronic kidney disease stage IV, and pernicious anemia presenting with 1 day of slurred speech and left facial droop. She did not receive IV t-PA due to delay in arrival.  MRI right lenticulostriate infarct secondary to small vessel disease source   Assessment / Plan / Recommendation Clinical Impression  Pt is delightful 82 yr old female with a subtle dysarthria, but high functioning cognition, language.  Active reader who stays up until 2:00 am reading her books.  No SLP f/u is warranted - will sign off.     SLP Assessment  Patient does not need any further Speech Lanaguage Pathology  Services    Follow Up Recommendations       Frequency and Duration           SLP Evaluation Prior Functioning  Type of Home: House Available Help at Discharge: Family;Available 24 hours/day   Cognition  Overall Cognitive Status: Within Functional Limits for tasks assessed Arousal/Alertness: Awake/alert Orientation Level: Oriented X4 Attention: Selective Selective Attention: Appears intact Memory: Appears intact Awareness: Appears intact Problem Solving: Appears intact    Comprehension  Visual Recognition/Discrimination Discrimination: Within Function Limits Reading Comprehension Reading Status: Within funtional limits    Expression Expression Primary Mode of Expression: Verbal Verbal Expression Overall Verbal Expression: Appears within functional limits for tasks assessed Written Expression Dominant Hand: Right Written Expression: Not tested   Oral / Motor  Oral Motor/Sensory Function Overall Oral Motor/Sensory Function: Mild impairment Facial Symmetry: Abnormal symmetry left Motor Speech Overall Motor Speech: Appears within functional limits for tasks assessed   GO                    Blenda MountsCouture, Jennifer Pittman 10/28/2015, 11:45 AM

## 2015-10-29 MED ORDER — CLOPIDOGREL BISULFATE 75 MG PO TABS
75.0000 mg | ORAL_TABLET | Freq: Every day | ORAL | Status: DC
Start: 2015-10-29 — End: 2015-11-29

## 2015-10-29 MED ORDER — ATORVASTATIN CALCIUM 10 MG PO TABS: 10.0000 mg | ORAL_TABLET | Freq: Every day | ORAL | Status: AC

## 2015-10-29 NOTE — Discharge Summary (Signed)
Physician Discharge Summary  Jennifer Pittman EXB:[id number] DOB: 03/02/1918 DOA: 10/26/2015  PCP: Georgianne Fick, MD  Admit date: 10/26/2015 Discharge date: 10/29/2015  Time spent: > 35 minutes  Recommendations for Outpatient Follow-up:  1. Follow-up with potassium levels 2. Patient had mild elevation in troponin echocardiogram reported no regional wall motion abnormalities and patient did not report any chest pain   Discharge Diagnoses:  Principal Problem:   Facial droop Active Problems:   Stroke (cerebrum) (HCC)   HTN (hypertension)   BBB (bundle branch block)   Pernicious anemia   Osteoarthritis   E. coli UTI: 10/19/2015 at PCP office   B12 deficiency   Hypothyroidism   Hyperlipidemia   CKD (chronic kidney disease), stage IV (HCC)   Elevated troponin   Slurred speech   Acute CVA (cerebrovascular accident) [facility])   Discharge Condition: stable  Diet recommendation: heart healthy  Filed Weights   10/27/15 0500  Weight: 56.019 kg (123 lb 8 oz)    History of present illness:  82 year old who presented to the hospital with 1 day history of slurred speech, dysarthria, expressive aphasia, and slightly facial droop.  Hospital Course:  Stroke - Neurology consulted and recommended the following after evaluation Stroke: right lenticulostriate infarct secondary to small vessel disease source  Resultant Facial droop resolved, remains dysarthric  MRI right lenticular nucleus extending to posterior right corona radiata with involvement of the right external capsule infarct  MRA Asymptomatic High-grade stenosis proximal to mid basilar artery, diffuse intracranial atherosclerosis   Carotid Doppler No significant stenosis   2D Echo EF 55-60%   LDL 128  HgbA1c pending  Heparin 5000 units sq tid for VTE prophylaxis  Diet Heart Room service appropriate?: Yes; Fluid consistency:: Thin  aspirin 81 mg daily prior to admission, now on aspirin 325 mg daily. Given stroke  on aspirin, change to plavix 75 mg daily.  Patient counseled to be compliant with her antithrombotic medications  Ongoing aggressive stroke risk factor management  Therapy recommendations: Consider HH PT   Disposition: Return home with family (lives with son and daughter in law)  Hypertension  Stable  Permissive hypertension (OK if < 220/120) but gradually normalize in 5-7 days  Hyperlipidemia  Home meds: No statin  LDL 128, goal < 70  Added lipitor 10 mg daily  Continue statin at discharge   Procedures:  Please see above  Consultations:  Neurology  Discharge Exam: Filed Vitals:   10/29/15 0949 10/29/15 1259  BP: 129/46 142/56  Pulse: 65 79  Temp: 98.2 F (36.8 C) 98.8 F (37.1 C)  Resp: 16 16    General: Pt in nad, awake and alert Cardiovascular: s1 and s2 wnl, no rubs Respiratory: CTA BL, no wheezes  Discharge Instructions   Discharge Instructions    Ambulatory referral to Neurology    Complete by:  As directed   Follow up with carolyn Daphine Deutscher NP at [facility] in one month. Thanks.          Current Discharge Medication List    CONTINUE these medications which have NOT CHANGED   Details  amLODipine (NORVASC) 10 MG tablet Take 10 mg by mouth daily.    aspirin EC 81 MG tablet Take 81 mg by mouth daily.    Cholecalciferol (VITAMIN D) 2000 units tablet Take 2,000 Units by mouth daily.    Cyanocobalamin (B-12 IJ) Inject 1,000 mcg as directed every 30 (thirty) days.    dorzolamide-timolol (COSOPT) 22.3-6.8 MG/ML ophthalmic solution Place 1 drop into both eyes 2 (two) times daily.  hydrochlorothiazide (HYDRODIURIL) 50 MG tablet Take 50 mg by mouth daily.    latanoprost (XALATAN) 0.005 % ophthalmic solution Place 1 drop into both eyes at bedtime.    levothyroxine (SYNTHROID, LEVOTHROID) 112 MCG tablet Take 112 mcg by mouth daily before breakfast.    Multiple Vitamin (MULTIVITAMIN WITH MINERALS) TABS tablet Take 1 tablet by mouth daily.     telmisartan (MICARDIS) 20 MG tablet Take 20 mg by mouth daily.       No Known Allergies Follow-up Information    Follow up with Nilda Riggs, NP. Schedule an appointment as soon as possible for a visit in 1 month.   Specialty:  Family Medicine   Why:  stroke clinic   Contact information:   [address] [place] [address] [phone]        The results of significant diagnostics from this hospitalization (including imaging, microbiology, ancillary and laboratory) are listed below for reference.    Significant Diagnostic Studies: Dg Chest 2 View  10/26/2015  CLINICAL DATA:  82 year old female with history of facial droop and weakness. Possible stroke-like symptoms. No chest complaints. EXAM: CHEST  2 VIEW COMPARISON:  No priors. FINDINGS: No acute consolidative airspace disease. No pleural effusions. Mild diffuse interstitial prominence, likely chronic (no prior studies are available for comparison). No pneumothorax. No suspicious appearing pulmonary nodules or masses. No evidence of pulmonary edema. Heart size is upper limits of normal. Atherosclerotic calcifications throughout the thoracic aorta. Upper mediastinal contours are within normal limits. IMPRESSION: 1. No radiographic evidence of acute cardiopulmonary disease. 2. Atherosclerosis. Electronically Signed   By: Trudie Reed M.D.   On: 10/26/2015 20:01   Ct Head Wo Contrast  10/26/2015  CLINICAL DATA:  Left facial droop today.  Initial encounter. EXAM: CT HEAD WITHOUT CONTRAST TECHNIQUE: Contiguous axial images were obtained from the base of the skull through the vertex without intravenous contrast. COMPARISON:  None. FINDINGS: There is cortical atrophy and chronic microvascular ischemic change. No evidence of acute intracranial abnormality including hemorrhage, infarct, midline shift or abnormal extra-axial fluid collection is identified. No hydrocephalus or pneumocephalus. The calvarium is intact. Imaged  paranasal sinuses and mastoid air cells are clear. IMPRESSION: No acute abnormality. Mild atrophy and chronic microvascular ischemic change. Electronically Signed   By: Drusilla Kanner M.D.   On: 10/26/2015 16:07   Mr Brain Wo Contrast  10/27/2015  CLINICAL DATA:  82 year old hypertensive female with slurred speech. Left facial droop. Subsequent encounter. EXAM: MRI HEAD WITHOUT CONTRAST MRA HEAD WITHOUT CONTRAST TECHNIQUE: Multiplanar, multiecho pulse sequences of the brain and surrounding structures were obtained without intravenous contrast. Angiographic images of the head were obtained using MRA technique without contrast. COMPARISON:  10/26/2015 CT. FINDINGS: MRI HEAD FINDINGS Acute nonhemorrhagic small to moderate-size infarct posterior right lenticular nucleus extending to posterior right corona radiata with involvement of the right external capsule. No intracranial hemorrhage. Very mild small vessel disease changes. Mild global atrophy without hydrocephalus. No intracranial mass lesion noted on this unenhanced exam. Mild prominence transverse ligament. Cerebellar tonsils minimally low lying but within the range of normal limits. Post lens replacement otherwise orbital structures unremarkable. Pituitary region within normal limits. MRA HEAD FINDINGS Mild to moderate narrowing supraclinoid aspect of the internal carotid artery bilaterally. Left posterior communicating artery 3 mm bulge suggestive of aneurysm rather than prominent infundibulum Hypoplastic A1 segment right anterior cerebral artery moderate to marked narrowing. Mild narrowing M1 segment right middle cerebral artery. Moderate narrowing M1 segment left middle cerebral artery. Middle cerebral artery  moderate to marked branch vessel narrowing and irregularity bilaterally. A2 segment anterior cerebral artery mild to slightly moderate narrowing more notable on the right. Right vertebral artery ends in a posterior inferior cerebellar artery  distribution with moderate to marked narrowing at the junction of the vertebral artery and formation of the right posterior inferior cerebellar artery. Moderate to marked narrowing distal left vertebral artery. Poor delineation of the left posterior inferior cerebellar artery. 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 Nonvisualized anterior inferior cerebellar artery. Fetal type contribution to formation of the left posterior cerebral artery. Moderate to marked narrowing of portions of the posterior cerebral arteries bilaterally more notable on the right. Slight bulge basilar tip on the left most likely related to the fetal contribution to the left posterior cerebral artery rather than saccular aneurysm. IMPRESSION: MRI HEAD Acute nonhemorrhagic small to moderate-size infarct posterior right lenticular nucleus extending to posterior right corona radiata with involvement of the right external capsule. No intracranial hemorrhage. Very mild small vessel disease changes. Mild global atrophy without hydrocephalus. MRA HEAD Mild to moderate narrowing supraclinoid aspect of the internal carotid artery bilaterally. Left posterior communicating artery 3 mm bulge suggestive of aneurysm rather than prominent infundibulum Hypoplastic A1 segment right anterior cerebral artery moderate to marked narrowing. Mild narrowing M1 segment right middle cerebral artery. Moderate narrowing M1 segment left middle cerebral artery. Middle cerebral artery moderate to marked branch vessel narrowing and irregularity bilaterally. A2 segment anterior cerebral artery mild to slightly moderate narrowing more notable on the right. Right vertebral artery ends in a posterior inferior cerebellar artery distribution with moderate to marked narrowing at the junction of the right vertebral artery and formation of the right posterior inferior cerebellar artery. Moderate to marked narrowing distal left vertebral artery. Poor delineation of the left  posterior inferior cerebellar artery. 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 Nonvisualized anterior inferior cerebellar artery. Fetal type contribution to formation of the left posterior cerebral artery. Moderate to marked narrowing of portions of the posterior cerebral arteries bilaterally more notable on the right. Electronically Signed   By: Lacy DuverneySteven  Olson M.D.   On: 10/27/2015 11:45   Mr Maxine GlennMra Head/brain Wo Cm  10/27/2015  CLINICAL DATA:  82 year old hypertensive female with slurred speech. Left facial droop. Subsequent encounter. EXAM: MRI HEAD WITHOUT CONTRAST MRA HEAD WITHOUT CONTRAST TECHNIQUE: Multiplanar, multiecho pulse sequences of the brain and surrounding structures were obtained without intravenous contrast. Angiographic images of the head were obtained using MRA technique without contrast. COMPARISON:  10/26/2015 CT. FINDINGS: MRI HEAD FINDINGS Acute nonhemorrhagic small to moderate-size infarct posterior right lenticular nucleus extending to posterior right corona radiata with involvement of the right external capsule. No intracranial hemorrhage. Very mild small vessel disease changes. Mild global atrophy without hydrocephalus. No intracranial mass lesion noted on this unenhanced exam. Mild prominence transverse ligament. Cerebellar tonsils minimally low lying but within the range of normal limits. Post lens replacement otherwise orbital structures unremarkable. Pituitary region within normal limits. MRA HEAD FINDINGS Mild to moderate narrowing supraclinoid aspect of the internal carotid artery bilaterally. Left posterior communicating artery 3 mm bulge suggestive of aneurysm rather than prominent infundibulum Hypoplastic A1 segment right anterior cerebral artery moderate to marked narrowing. Mild narrowing M1 segment right middle cerebral artery. Moderate narrowing M1 segment left middle cerebral artery. Middle cerebral artery moderate to marked branch vessel narrowing and irregularity  bilaterally. A2 segment anterior cerebral artery mild to slightly moderate narrowing more notable on the right. Right vertebral artery ends in a posterior inferior  cerebellar artery distribution with moderate to marked narrowing at the junction of the vertebral artery and formation of the right posterior inferior cerebellar artery. Moderate to marked narrowing distal left vertebral artery. Poor delineation of the left posterior inferior cerebellar artery. 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 Nonvisualized anterior inferior cerebellar artery. Fetal type contribution to formation of the left posterior cerebral artery. Moderate to marked narrowing of portions of the posterior cerebral arteries bilaterally more notable on the right. Slight bulge basilar tip on the left most likely related to the fetal contribution to the left posterior cerebral artery rather than saccular aneurysm. IMPRESSION: MRI HEAD Acute nonhemorrhagic small to moderate-size infarct posterior right lenticular nucleus extending to posterior right corona radiata with involvement of the right external capsule. No intracranial hemorrhage. Very mild small vessel disease changes. Mild global atrophy without hydrocephalus. MRA HEAD Mild to moderate narrowing supraclinoid aspect of the internal carotid artery bilaterally. Left posterior communicating artery 3 mm bulge suggestive of aneurysm rather than prominent infundibulum Hypoplastic A1 segment right anterior cerebral artery moderate to marked narrowing. Mild narrowing M1 segment right middle cerebral artery. Moderate narrowing M1 segment left middle cerebral artery. Middle cerebral artery moderate to marked branch vessel narrowing and irregularity bilaterally. A2 segment anterior cerebral artery mild to slightly moderate narrowing more notable on the right. Right vertebral artery ends in a posterior inferior cerebellar artery distribution with moderate to marked narrowing at the junction  of the right vertebral artery and formation of the right posterior inferior cerebellar artery. Moderate to marked narrowing distal left vertebral artery. Poor delineation of the left posterior inferior cerebellar artery. 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 Nonvisualized anterior inferior cerebellar artery. Fetal type contribution to formation of the left posterior cerebral artery. Moderate to marked narrowing of portions of the posterior cerebral arteries bilaterally more notable on the right. Electronically Signed   By: Lacy Duverney M.D.   On: 10/27/2015 11:45    Microbiology: No results found for this or any previous visit (from the past 240 hour(s)).   Labs: Basic Metabolic Panel:  Recent Labs Lab 10/26/15 1520 10/26/15 1537 10/26/15 2010 10/27/15 0639  NA 139 137  --  138  K 3.9 3.8  --  3.4*  CL 105 105  --  106  CO2 23  --   --  24  GLUCOSE 109* 104*  --  96  BUN 34* 36*  --  30*  CREATININE 1.69* 1.70*  --  1.58*  CALCIUM 9.5  --   --  8.9  MG  --   --  1.7 2.7*   Liver Function Tests:  Recent Labs Lab 10/26/15 1520  AST 20  ALT 11*  ALKPHOS 46  BILITOT 0.7  PROT 6.2*  ALBUMIN 3.5   No results for input(s): LIPASE, AMYLASE in the last 168 hours. No results for input(s): AMMONIA in the last 168 hours. CBC:  Recent Labs Lab 10/26/15 1520 10/26/15 1537 10/27/15 0639 10/28/15 0801  WBC 9.6  --  7.3 8.8  NEUTROABS 6.3  --   --   --   HGB 11.3* 12.6 10.3* 11.6*  HCT 35.1* 37.0 30.4* 34.6*  MCV 94.4  --  93.8 94.5  PLT 200  --  181 208   Cardiac Enzymes:  Recent Labs Lab 10/27/15 0107 10/27/15 0639 10/27/15 1330 10/28/15 0801  TROPONINI 0.15* 0.19* 0.16* 0.14*   BNP: BNP (last 3 results) No results for input(s): BNP in the last 8760 hours.  ProBNP (  last 3 results) No results for input(s): PROBNP in the last 8760 hours.  CBG: No results for input(s): GLUCAP in the last 168 hours.   Signed:  [name] MD.  [facility] 10/29/2015, 4:08 PM

## 2015-10-29 NOTE — Progress Notes (Signed)
Occupational Therapy Treatment Patient Details Name: Jennifer Pittman MRN: [id number] DOB: 1918-01-24 Today's Date: 10/29/2015    History of present illness Functional at baseline with history of hypertension, hypothyroidism, left bundle branch block, chronic kidney disease stage IV, pernicious anemia presented to the ED with a one-day history of slurred speech/dysarthria with an expressive aphasia and slight left facial droop  MRI findings Acute nonhemorrhagic small to moderate-size infarct posterior right lenticular nucleus extending to posterior right corona radiata with involvement of the right external capsule.   OT comments  Provided level 1 theraband with demonstration and explanation of use for HEP.  Son and dtr-in-law present and also verbalize understanding of use.  Pt. Highly motivated.  Will continue to follow acutely.    Follow Up Recommendations  No OT follow up;Supervision/Assistance - 24 hour    Equipment Recommendations  None recommended by OT    Recommendations for Other Services      Precautions / Restrictions Precautions Precautions: Fall Restrictions Weight Bearing Restrictions: No       Mobility Bed Mobility                  Transfers                      Balance                                   ADL                                                Vision                     Perception     Praxis      Cognition   Behavior During Therapy: WFL for tasks assessed/performed Overall Cognitive Status: Within Functional Limits for tasks assessed                       Extremity/Trunk Assessment               Exercises General Exercises - Upper Extremity Shoulder Flexion: Strengthening;Both;5 reps;Seated;Theraband Theraband Level (Shoulder Flexion): Level 1 (Yellow) Shoulder Extension: Strengthening;Both;5 reps;Seated;Theraband Theraband Level (Shoulder Extension): Level 1  (Yellow) Shoulder ABduction: Strengthening;Both;5 reps;Seated;Theraband Theraband Level (Shoulder Abduction): Level 1 (Yellow) Shoulder ADduction: Strengthening;Both;5 reps;Seated;Theraband Theraband Level (Shoulder Adduction): Level 1 (Yellow) Shoulder Horizontal ABduction: Strengthening;Both;5 reps;Theraband Theraband Level (Shoulder Horizontal Abduction): Level 1 (Yellow) Shoulder Horizontal ADduction: Strengthening;Both;5 reps;Seated;Theraband Theraband Level (Shoulder Horizontal Adduction): Level 1 (Yellow)   Shoulder Instructions       General Comments      Pertinent Vitals/ Pain       Pain Assessment: No/denies pain  Home Living                                          Prior Functioning/Environment              Frequency Min 2X/week     Progress Toward Goals  OT Goals(current goals can now be found in the care plan section)  Progress towards OT goals: Progressing toward goals     Plan Discharge plan remains appropriate  Co-evaluation                 End of Session Equipment Utilized During Treatment: Other (comment) (provided theraband for pt. use (level 1))   Activity Tolerance Patient tolerated treatment well   Patient Left in bed;with call bell/phone within reach;with bed alarm set;with nursing/sitter in room;with family/visitor present   Nurse Communication          Time: 1000-1014 OT Time Calculation (min): 14 min  Charges: OT General Charges $OT Visit: 1 Procedure OT Treatments $Therapeutic Exercise: 8-22 mins  Robet LeuMorris, Darlys Buis [place], COTA/L 10/29/2015, 10:23 AM

## 2015-10-29 NOTE — Care Management Note (Signed)
Case Management Note  Patient Details  Name: Jennifer Pittman MRN: [id number] Date of Birth: 08-26-1918  Subjective/Objective:                  Facial droop Action/Plan: Discharge planning Expected Discharge Date:  10/29/15               Expected Discharge Plan:  [place]  In-House Referral:     Discharge planning Services  CM Consult  Post Acute Care Choice:  NA Choice offered to:  Patient, Adult Children  DME Arranged:  N/A DME Agency:  NA  HH Arranged:  PT [place] Agency:  [place]  Status of Service:  Completed, signed off  Medicare Important Message Given:    Date Medicare IM Given:    Medicare IM give by:    Date Additional Medicare IM Given:    Additional Medicare Important Message give by:     If discussed at [place] of Stay Meetings, dates discussed:    Additional Comments: CM met with pt and son Jenny Reichmann and daughter in law Marcie Bal in room to offer choice of home health agency.  Pt chooses [facility] to render HHPT.  Family states they can be reached at [phone].  Family states they have rolling walker and elevated commode at home.  Referral called to [facility] rep, Tiffany.  No other CM needs were communicated. Dellie Catholic, RN 10/29/2015, 4:25 PM

## 2015-10-30 NOTE — Progress Notes (Signed)
Pt discharged home with family and home health services. IV discontinued. Discharge instructions given. No questions from patient or family. Left unit via wheelchair with NT at 1515. Lawson RadarHeather M Chealsey Miyamoto

## 2015-11-01 DIAGNOSIS — E039 Hypothyroidism, unspecified: Secondary | ICD-10-CM | POA: Diagnosis not present

## 2015-11-01 DIAGNOSIS — E785 Hyperlipidemia, unspecified: Secondary | ICD-10-CM | POA: Diagnosis not present

## 2015-11-01 DIAGNOSIS — D510 Vitamin B12 deficiency anemia due to intrinsic factor deficiency: Secondary | ICD-10-CM | POA: Diagnosis not present

## 2015-11-01 DIAGNOSIS — Z8673 Personal history of transient ischemic attack (TIA), and cerebral infarction without residual deficits: Secondary | ICD-10-CM | POA: Diagnosis not present

## 2015-11-01 DIAGNOSIS — Z87440 Personal history of urinary (tract) infections: Secondary | ICD-10-CM | POA: Diagnosis not present

## 2015-11-01 DIAGNOSIS — N184 Chronic kidney disease, stage 4 (severe): Secondary | ICD-10-CM | POA: Diagnosis not present

## 2015-11-01 DIAGNOSIS — I129 Hypertensive chronic kidney disease with stage 1 through stage 4 chronic kidney disease, or unspecified chronic kidney disease: Secondary | ICD-10-CM | POA: Diagnosis not present

## 2015-11-03 DIAGNOSIS — Z87440 Personal history of urinary (tract) infections: Secondary | ICD-10-CM | POA: Diagnosis not present

## 2015-11-03 DIAGNOSIS — E785 Hyperlipidemia, unspecified: Secondary | ICD-10-CM | POA: Diagnosis not present

## 2015-11-03 DIAGNOSIS — D510 Vitamin B12 deficiency anemia due to intrinsic factor deficiency: Secondary | ICD-10-CM | POA: Diagnosis not present

## 2015-11-03 DIAGNOSIS — N184 Chronic kidney disease, stage 4 (severe): Secondary | ICD-10-CM | POA: Diagnosis not present

## 2015-11-03 DIAGNOSIS — Z8673 Personal history of transient ischemic attack (TIA), and cerebral infarction without residual deficits: Secondary | ICD-10-CM | POA: Diagnosis not present

## 2015-11-03 DIAGNOSIS — I129 Hypertensive chronic kidney disease with stage 1 through stage 4 chronic kidney disease, or unspecified chronic kidney disease: Secondary | ICD-10-CM | POA: Diagnosis not present

## 2015-11-07 DIAGNOSIS — I10 Essential (primary) hypertension: Secondary | ICD-10-CM | POA: Diagnosis not present

## 2015-11-08 DIAGNOSIS — I129 Hypertensive chronic kidney disease with stage 1 through stage 4 chronic kidney disease, or unspecified chronic kidney disease: Secondary | ICD-10-CM | POA: Diagnosis not present

## 2015-11-08 DIAGNOSIS — E785 Hyperlipidemia, unspecified: Secondary | ICD-10-CM | POA: Diagnosis not present

## 2015-11-08 DIAGNOSIS — D510 Vitamin B12 deficiency anemia due to intrinsic factor deficiency: Secondary | ICD-10-CM | POA: Diagnosis not present

## 2015-11-08 DIAGNOSIS — Z8673 Personal history of transient ischemic attack (TIA), and cerebral infarction without residual deficits: Secondary | ICD-10-CM | POA: Diagnosis not present

## 2015-11-08 DIAGNOSIS — N184 Chronic kidney disease, stage 4 (severe): Secondary | ICD-10-CM | POA: Diagnosis not present

## 2015-11-08 DIAGNOSIS — Z87440 Personal history of urinary (tract) infections: Secondary | ICD-10-CM | POA: Diagnosis not present

## 2015-11-10 DIAGNOSIS — N184 Chronic kidney disease, stage 4 (severe): Secondary | ICD-10-CM | POA: Diagnosis not present

## 2015-11-10 DIAGNOSIS — E876 Hypokalemia: Secondary | ICD-10-CM | POA: Diagnosis not present

## 2015-11-10 DIAGNOSIS — E782 Mixed hyperlipidemia: Secondary | ICD-10-CM | POA: Diagnosis not present

## 2015-11-10 DIAGNOSIS — Z09 Encounter for follow-up examination after completed treatment for conditions other than malignant neoplasm: Secondary | ICD-10-CM | POA: Diagnosis not present

## 2015-11-11 DIAGNOSIS — E785 Hyperlipidemia, unspecified: Secondary | ICD-10-CM | POA: Diagnosis not present

## 2015-11-11 DIAGNOSIS — I129 Hypertensive chronic kidney disease with stage 1 through stage 4 chronic kidney disease, or unspecified chronic kidney disease: Secondary | ICD-10-CM | POA: Diagnosis not present

## 2015-11-11 DIAGNOSIS — Z87440 Personal history of urinary (tract) infections: Secondary | ICD-10-CM | POA: Diagnosis not present

## 2015-11-11 DIAGNOSIS — N184 Chronic kidney disease, stage 4 (severe): Secondary | ICD-10-CM | POA: Diagnosis not present

## 2015-11-11 DIAGNOSIS — D510 Vitamin B12 deficiency anemia due to intrinsic factor deficiency: Secondary | ICD-10-CM | POA: Diagnosis not present

## 2015-11-11 DIAGNOSIS — Z8673 Personal history of transient ischemic attack (TIA), and cerebral infarction without residual deficits: Secondary | ICD-10-CM | POA: Diagnosis not present

## 2015-11-15 DIAGNOSIS — N184 Chronic kidney disease, stage 4 (severe): Secondary | ICD-10-CM | POA: Diagnosis not present

## 2015-11-15 DIAGNOSIS — D510 Vitamin B12 deficiency anemia due to intrinsic factor deficiency: Secondary | ICD-10-CM | POA: Diagnosis not present

## 2015-11-15 DIAGNOSIS — E785 Hyperlipidemia, unspecified: Secondary | ICD-10-CM | POA: Diagnosis not present

## 2015-11-15 DIAGNOSIS — Z8673 Personal history of transient ischemic attack (TIA), and cerebral infarction without residual deficits: Secondary | ICD-10-CM | POA: Diagnosis not present

## 2015-11-15 DIAGNOSIS — Z87440 Personal history of urinary (tract) infections: Secondary | ICD-10-CM | POA: Diagnosis not present

## 2015-11-15 DIAGNOSIS — I129 Hypertensive chronic kidney disease with stage 1 through stage 4 chronic kidney disease, or unspecified chronic kidney disease: Secondary | ICD-10-CM | POA: Diagnosis not present

## 2015-11-17 DIAGNOSIS — Z8673 Personal history of transient ischemic attack (TIA), and cerebral infarction without residual deficits: Secondary | ICD-10-CM | POA: Diagnosis not present

## 2015-11-17 DIAGNOSIS — E785 Hyperlipidemia, unspecified: Secondary | ICD-10-CM | POA: Diagnosis not present

## 2015-11-17 DIAGNOSIS — N184 Chronic kidney disease, stage 4 (severe): Secondary | ICD-10-CM | POA: Diagnosis not present

## 2015-11-17 DIAGNOSIS — Z87440 Personal history of urinary (tract) infections: Secondary | ICD-10-CM | POA: Diagnosis not present

## 2015-11-17 DIAGNOSIS — D510 Vitamin B12 deficiency anemia due to intrinsic factor deficiency: Secondary | ICD-10-CM | POA: Diagnosis not present

## 2015-11-17 DIAGNOSIS — I129 Hypertensive chronic kidney disease with stage 1 through stage 4 chronic kidney disease, or unspecified chronic kidney disease: Secondary | ICD-10-CM | POA: Diagnosis not present

## 2015-11-21 DIAGNOSIS — N184 Chronic kidney disease, stage 4 (severe): Secondary | ICD-10-CM | POA: Diagnosis not present

## 2015-11-21 DIAGNOSIS — Z8673 Personal history of transient ischemic attack (TIA), and cerebral infarction without residual deficits: Secondary | ICD-10-CM | POA: Diagnosis not present

## 2015-11-21 DIAGNOSIS — I129 Hypertensive chronic kidney disease with stage 1 through stage 4 chronic kidney disease, or unspecified chronic kidney disease: Secondary | ICD-10-CM | POA: Diagnosis not present

## 2015-11-21 DIAGNOSIS — Z87440 Personal history of urinary (tract) infections: Secondary | ICD-10-CM | POA: Diagnosis not present

## 2015-11-29 ENCOUNTER — Ambulatory Visit (INDEPENDENT_AMBULATORY_CARE_PROVIDER_SITE_OTHER): Payer: Medicare Other | Admitting: Nurse Practitioner

## 2015-11-29 ENCOUNTER — Encounter: Payer: Self-pay | Admitting: Nurse Practitioner

## 2015-11-29 VITALS — BP 111/65 | HR 64 | Ht 64.0 in | Wt 120.0 lb

## 2015-11-29 DIAGNOSIS — R4781 Slurred speech: Secondary | ICD-10-CM | POA: Diagnosis not present

## 2015-11-29 DIAGNOSIS — I639 Cerebral infarction, unspecified: Secondary | ICD-10-CM

## 2015-11-29 DIAGNOSIS — E785 Hyperlipidemia, unspecified: Secondary | ICD-10-CM

## 2015-11-29 MED ORDER — CLOPIDOGREL BISULFATE 75 MG PO TABS: 75.0000 mg | ORAL_TABLET | Freq: Every day | ORAL | Status: DC

## 2015-11-29 NOTE — Progress Notes (Signed)
[facility]  PATIENT: Jennifer Pittman DOB: 02/07/1918   REASON FOR VISIT: Hospital follow-up for stroke HISTORY FROM:patient    HISTORY OF PRESENT ILLNESS:Jennifer Pittman is a 82 y.o. female returns for hospital follow-up with a history of previous stroke 12 years ago  with who is very active and independent at baseline, still living at home. On 10/26/15  she was slurring her speech. Her daughter gave her something to eat and she noticed that it was drooling out the side of her mouth .She saw her PCP the next day and was sent to the ER. On hospital admission she still feels like she is slurring her speech, and that her left side of her face is not quite as good as her right. Premorbid modified rankin scale: 2. Patient was not administered TPA secondary to delay in arrival. MRI of the brain with acute nonhemorrhagic small moderate-sized infarct posterior right lenticular nucleus extending to posterior right corona radiata with involvement of the right external capsule. No intracranial hemorrhage. Very mild small vessel disease changes. Mild global atrophy without hydrocephalus. 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 Carotid Doppler no significant stenosis. 2-D echo 55-60% EF. LDL 128. Hemoglobin A1c 5.8. She was switched from aspirin to Plavix. Since discharge she has not had further stroke or TIA symptoms. She did receive some physical therapy for 3 weeks and that was discontinued. She has continuing to do her exercises. She has a long history of pernicious anemia and receives vitamin B12 shots monthly.  She returns for reevaluation  REVIEW OF SYSTEMS: Full 14 system review of systems performed and notable only for those listed, all others are neg:  Constitutional: neg  Cardiovascular: neg Ear/Nose/Throat: neg  Skin: neg Eyes: neg Respiratory: neg Gastroitestinal: neg  Hematology/Lymphatic: neg    Endocrine: neg Musculoskeletal:neg Allergy/Immunology: neg Neurological: neg Psychiatric: neg Sleep : neg   ALLERGIES: No Known Allergies  HOME MEDICATIONS: Outpatient Prescriptions Prior to Visit  Medication Sig Dispense Refill  . amLODipine (NORVASC) 10 MG tablet Take 10 mg by mouth daily.    Marland Kitchen. atorvastatin (LIPITOR) 10 MG tablet Take 1 tablet (10 mg total) by mouth daily at 6 PM. 30 tablet 0  . Cholecalciferol (VITAMIN D) 2000 units tablet Take 2,000 Units by mouth daily.    . clopidogrel (PLAVIX) 75 MG tablet Take 1 tablet (75 mg total) by mouth daily. 30 tablet 0  . Cyanocobalamin (B-12 IJ) Inject 1,000 mcg as directed every 30 (thirty) days.    . dorzolamide-timolol (COSOPT) 22.3-6.8 MG/ML ophthalmic solution Place 1 drop into both eyes 2 (two) times daily.    Marland Kitchen. latanoprost (XALATAN) 0.005 % ophthalmic solution Place 1 drop into both eyes at bedtime.    Marland Kitchen. levothyroxine (SYNTHROID, LEVOTHROID) 112 MCG tablet Take 112 mcg by mouth daily before breakfast.    . Multiple Vitamin (MULTIVITAMIN WITH MINERALS) TABS tablet Take 1 tablet by mouth daily.     No facility-administered medications prior to visit.    PAST MEDICAL HISTORY: Past Medical History  Diagnosis Date  . Hypertension   . Thyroid disease   . Bundle branch block, left   . Pernicious anemia   . Osteoarthritis   . B12 deficiency 10/26/2015  . Hypothyroidism 10/26/2015  . CKD (chronic kidney disease), stage IV (HCC) 10/26/2015  . Stroke [facility](HCC)     PAST SURGICAL HISTORY: Past Surgical History  Procedure Laterality Date  . Hyterectomy    . Abdominal hysterectomy    .  Total hip arthroplasty Left     FAMILY HISTORY: History reviewed. No pertinent family history.  SOCIAL HISTORY: Social History   Social History  . Marital Status: Married    Spouse Name: N/A  . Number of Children: N/A  . Years of Education: N/A   Occupational History  . Not on file.   Social History Main Topics  . Smoking status: Never  Smoker   . Smokeless tobacco: Never Used  . Alcohol Use: No  . Drug Use: No  . Sexual Activity: Not on file   Other Topics Concern  . Not on file   Social History Narrative     PHYSICAL EXAM  Filed Vitals:   11/29/15 1413  BP: 111/65  Pulse: 64  Height:  (1.626 m)  Weight: 120 lb (54.432 kg)   Body mass index is 20.59 kg/(m^2).  Generalized: Frail and thin  developed female  in no acute distress  Head: normocephalic and atraumatic,. Oropharynx benign  Neck: Supple, no carotid bruits  Cardiac: Regular rate rhythm, no murmur  Musculoskeletal: No deformity   Neurological examination   Mentation: Alert oriented to time, place, history taking. Attention span and concentration appropriate. Recent and remote memory intact.  Follows all commands speech and language fluent.   Cranial nerve II-XII: Fundoscopic exam reveals sharp disc margins.Pupils were equal round reactive to light extraocular movements were full, visual field were full on confrontational test. Facial sensation is normal, mild left nasal labial fold flattening.Hearing was intact to finger rubbing bilaterally. Uvula tongue midline. head turning and shoulder shrug were normal and symmetric.Tongue protrusion into cheek strength was normal. Motor: normal bulk and tone, full strength in the BUE, BLE, fine finger movements normal, no pronator drift.  Sensory: normal and symmetric to light touch, pinprick,   Coordination: finger-nose-finger, heel-to-shin bilaterally, no dysmetria Reflexes: 1+ upper lower and symmetric plantar responses were flexor bilaterally. Gait and Station: in wheelchair not ambulated DIAGNOSTIC DATA (LABS, IMAGING, TESTING) - I reviewed patient records, labs, notes, testing and imaging myself where available.  Lab Results  Component Value Date   WBC 8.8 10/28/2015   HGB 11.6* 10/28/2015   HCT 34.6* 10/28/2015   MCV 94.5 10/28/2015   PLT 208 10/28/2015      Component Value Date/Time   NA  138 10/27/2015 0639   K 3.4* 10/27/2015 0639   CL 106 10/27/2015 0639   CO2 24 10/27/2015 0639   GLUCOSE 96 10/27/2015 0639   BUN 30* 10/27/2015 0639   CREATININE 1.58* 10/27/2015 0639   CALCIUM 8.9 10/27/2015 0639   PROT 6.2* 10/26/2015 1520   ALBUMIN 3.5 10/26/2015 1520   AST 20 10/26/2015 1520   ALT 11* 10/26/2015 1520   ALKPHOS 46 10/26/2015 1520   BILITOT 0.7 10/26/2015 1520   GFRNONAA 27* 10/27/2015 0639   GFRAA 31* 10/27/2015 0639   Lab Results  Component Value Date   CHOL 198 10/27/2015   HDL 58 10/27/2015   LDLCALC 128* 10/27/2015   TRIG 61 10/27/2015   CHOLHDL 3.4 10/27/2015   Lab Results  Component Value Date   HGBA1C 5.8* 10/27/2015    Lab Results  Component Value Date   TSH 2.755 10/27/2015      ASSESSMENT AND PLAN  82 y.o. year old female  has a past medical history of Hypertension; Thyroid disease; Bundle branch block, left; Pernicious anemia; Osteoarthritis; B12 deficiency (10/26/2015); Hypothyroidism (10/26/2015); CKD (chronic kidney disease), stage IV (HCC) (10/26/2015); and Stroke [facility]). here to follow-up for stroke.MRI of  the brain with acute nonhemorrhagic small moderate-sized infarct posterior right lenticular nucleus extending to posterior right corona radiata with involvement of the right external capsule. No intracranial hemorrhage. Very mild small vessel disease changes. Mild global atrophy without hydrocephalus. 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 Carotid Doppler no significant stenosis. 2-D echo 55-60% EF. LDL 128. Hemoglobin A1c 5.8.   PLAN: Control of stroke risk factors Continue Plavix for secondary stroke prevention refilled Continue Lipitor for secondary prevention, LDL 128 goal would be less than 70 Blood pressure in good control at 111/65, continue meds Continue exercises recommended by physical therapy Healthy diet with fresh fruits and vegetables Questions  answered for patient and daughter Follow-up with [place] in 3 months Vst time 30 min Nilda Riggs, [facility], [facility], APRN  [facility] [address] [place], [place] [address] [phone]

## 2015-11-29 NOTE — Patient Instructions (Signed)
Continue Plavix for secondary stroke prevention Continue Lipitor for secondary prevention, LDL 128 goal would be less than 70 Blood pressure in good control at 111/65, continue meds Continue exercises recommended by physical therapy Follow-up with us in 3 months

## 2015-11-29 NOTE — Progress Notes (Signed)
I reviewed above note and agree with the assessment and plan.  Marvel PlanJindong Coda Filler, MD PhD Stroke Neurology 11/29/2015 10:50 PM

## 2015-11-30 DIAGNOSIS — H524 Presbyopia: Secondary | ICD-10-CM | POA: Diagnosis not present

## 2015-11-30 DIAGNOSIS — Z961 Presence of intraocular lens: Secondary | ICD-10-CM | POA: Diagnosis not present

## 2015-11-30 DIAGNOSIS — H401133 Primary open-angle glaucoma, bilateral, severe stage: Secondary | ICD-10-CM | POA: Diagnosis not present

## 2015-11-30 DIAGNOSIS — H02403 Unspecified ptosis of bilateral eyelids: Secondary | ICD-10-CM | POA: Diagnosis not present

## 2015-12-08 DIAGNOSIS — E782 Mixed hyperlipidemia: Secondary | ICD-10-CM | POA: Diagnosis not present

## 2015-12-08 DIAGNOSIS — N184 Chronic kidney disease, stage 4 (severe): Secondary | ICD-10-CM | POA: Diagnosis not present

## 2015-12-15 DIAGNOSIS — N184 Chronic kidney disease, stage 4 (severe): Secondary | ICD-10-CM | POA: Diagnosis not present

## 2015-12-15 DIAGNOSIS — E039 Hypothyroidism, unspecified: Secondary | ICD-10-CM | POA: Diagnosis not present

## 2015-12-15 DIAGNOSIS — E782 Mixed hyperlipidemia: Secondary | ICD-10-CM | POA: Diagnosis not present

## 2015-12-15 DIAGNOSIS — I10 Essential (primary) hypertension: Secondary | ICD-10-CM | POA: Diagnosis not present

## 2016-03-06 ENCOUNTER — Ambulatory Visit (INDEPENDENT_AMBULATORY_CARE_PROVIDER_SITE_OTHER): Payer: Medicare Other | Admitting: Nurse Practitioner

## 2016-03-06 ENCOUNTER — Encounter: Payer: Self-pay | Admitting: Nurse Practitioner

## 2016-03-06 VITALS — BP 151/82 | HR 61 | Ht 64.0 in | Wt 115.4 lb

## 2016-03-06 DIAGNOSIS — I6320 Cerebral infarction due to unspecified occlusion or stenosis of unspecified precerebral arteries: Secondary | ICD-10-CM

## 2016-03-06 DIAGNOSIS — I10 Essential (primary) hypertension: Secondary | ICD-10-CM | POA: Diagnosis not present

## 2016-03-06 DIAGNOSIS — I639 Cerebral infarction, unspecified: Secondary | ICD-10-CM | POA: Diagnosis not present

## 2016-03-06 DIAGNOSIS — E785 Hyperlipidemia, unspecified: Secondary | ICD-10-CM

## 2016-03-06 MED ORDER — CLOPIDOGREL BISULFATE 75 MG PO TABS: 75.0000 mg | ORAL_TABLET | Freq: Every day | ORAL | Status: AC

## 2016-03-06 NOTE — Progress Notes (Signed)
[facility]  PATIENT: Jennifer Pittman DOB: 01/29/1918   REASON FOR VISIT: Follow-up for stroke secondary to small vessel disease  HISTORY FROM: Patient and daughter    HISTORY OF PRESENT ILLNESS: Update 03/06/2016 Jennifer Pittman, 82 year old female returns for follow-up with her daughter. She has a history of acute nonhemorrhagic small moderate-sized infarct posterior right lenticular nucleus extending to posterior right corona radiata with involvement of the right external capsule in March 2017. She is currently on Plavix for secondary stroke prevention without further stroke or TIA symptoms. She continues to exercise with her HEP. She remains on Lipitor without complaints of muscle cramping. Blood pressure mildly elevated in the office today at 151/82.   She was having problems with swelling in her bilateral lower extemities but is now wearing compression stockings, which has helped. No other concerns today.She returns for reevaluation  History 12/02/15 Jennifer Pittman is a 82 y.o. female returns for hospital follow-up with a history of previous stroke 12 years ago with who is very active and independent at baseline, still living at home. On 10/26/15 she was slurring her speech. Her daughter gave her something to eat and she noticed that it was drooling out the side of her mouth .She saw her PCP the next day and was sent to the ER. On hospital admission she still feels like she is slurring her speech, and that her left side of her face is not quite as good as her right. Premorbid modified rankin scale: 2. Patient was not administered TPA secondary to delay in arrival. MRI of the brain with acute nonhemorrhagic small moderate-sized infarct posterior right lenticular nucleus extending to posterior right corona radiata with involvement of the right external capsule. No intracranial hemorrhage. Very mild small vessel disease changes. Mild global atrophy without hydrocephalus. 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 Carotid Doppler no significant stenosis. 2-D echo 55-60% EF. LDL 128. Hemoglobin A1c 5.8. She was switched from aspirin to Plavix. Since discharge she has not had further stroke or TIA symptoms. She did receive some physical therapy for 3 weeks and that was discontinued. She has continuing to do her exercises. She has a long history of pernicious anemia and receives vitamin B12 shots monthly. She returns for reevaluation   REVIEW OF SYSTEMS: Full 14 system review of systems performed and notable only for those listed, all others are neg:  Constitutional: neg  Cardiovascular: neg Ear/Nose/Throat: neg  Skin: neg Eyes: neg Respiratory: neg Gastroitestinal: neg  Hematology/Lymphatic: neg  Endocrine: neg Musculoskeletal:neg Allergy/Immunology: neg Neurological: neg Psychiatric: neg Sleep : neg   ALLERGIES: No Known Allergies  HOME MEDICATIONS: Outpatient Prescriptions Prior to Visit  Medication Sig Dispense Refill  . amLODipine (NORVASC) 10 MG tablet Take 10 mg by mouth daily.    Marland Kitchen. atorvastatin (LIPITOR) 10 MG tablet Take 1 tablet (10 mg total) by mouth daily at 6 PM. 30 tablet 0  . Cholecalciferol (VITAMIN D) 2000 units tablet Take 2,000 Units by mouth daily.    . clopidogrel (PLAVIX) 75 MG tablet Take 1 tablet (75 mg total) by mouth daily. 30 tablet 6  . Cyanocobalamin (B-12 IJ) Inject 1,000 mcg as directed every 30 (thirty) days.    . dorzolamide-timolol (COSOPT) 22.3-6.8 MG/ML ophthalmic solution Place 1 drop into both eyes 2 (two) times daily.    . hydrochlorothiazide (HYDRODIURIL) 50 MG tablet Take 50 mg by mouth daily.    Marland Kitchen. latanoprost (XALATAN) 0.005 % ophthalmic solution  Place 1 drop into both eyes at bedtime.    Marland Kitchen levothyroxine (SYNTHROID, LEVOTHROID) 112 MCG tablet Take 112 mcg by mouth daily before breakfast.    . Multiple Vitamin (MULTIVITAMIN WITH MINERALS) TABS  tablet Take 1 tablet by mouth daily.    Marland Kitchen telmisartan (MICARDIS) 20 MG tablet Take 20 mg by mouth daily.     No facility-administered medications prior to visit.    PAST MEDICAL HISTORY: Past Medical History  Diagnosis Date  . Hypertension   . Thyroid disease   . Bundle branch block, left   . Pernicious anemia   . Osteoarthritis   . B12 deficiency 10/26/2015  . Hypothyroidism 10/26/2015  . CKD (chronic kidney disease), stage IV (HCC) 10/26/2015  . Stroke [facility])     PAST SURGICAL HISTORY: Past Surgical History  Procedure Laterality Date  . Hyterectomy    . Abdominal hysterectomy    . Total hip arthroplasty Left     FAMILY HISTORY: No family history on file.  SOCIAL HISTORY: Social History   Social History  . Marital Status: Married    Spouse Name: N/A  . Number of Children: N/A  . Years of Education: N/A   Occupational History  . Not on file.   Social History Main Topics  . Smoking status: Never Smoker   . Smokeless tobacco: Never Used  . Alcohol Use: No  . Drug Use: No  . Sexual Activity: Not on file   Other Topics Concern  . Not on file   Social History Narrative     PHYSICAL EXAM  Filed Vitals:   03/06/16 1433  BP: 151/82  Pulse: 61  Height: 5\' 4"  (1.626 m)  Weight: 115 lb 6.4 oz (52.345 kg)   Body mass index is 19.8 kg/(m^2). Generalized: Frail and thin developed female in no acute distress  Head: normocephalic and atraumatic,. Oropharynx benign  Neck: Supple, no carotid bruits  Cardiac: Regular rate rhythm, no murmur  Musculoskeletal: No deformity   Neurological examination   Mentation: Alert oriented to time, place, history taking. Attention span and concentration appropriate. Recent and remote memory intact. Follows all commands speech and language fluent.   Cranial nerve II-XII: Pupils were equal round reactive to light extraocular movements were full, visual field were full on confrontational test. Facial sensation is normal, mild  left nasal labial fold flattening.Hearing was intact to finger rubbing bilaterally. Uvula tongue midline. head turning and shoulder shrug were normal and symmetric.Tongue protrusion into cheek strength was normal. Motor: normal bulk and tone, full strength in the BUE, BLE, fine finger movements normal, no pronator drift.  Sensory: normal and symmetric to light touch, pinprick,  Coordination: finger-nose-finger, heel-to-shin bilaterally, no dysmetria Reflexes: 1+ upper lower and symmetric plantar responses were flexor bilaterally. Gait and Station: in wheelchair not ambulated  DIAGNOSTIC DATA (LABS, IMAGING, TESTING) - I reviewed patient records, labs, notes, testing and imaging myself where available.  Lab Results  Component Value Date   WBC 8.8 10/28/2015   HGB 11.6* 10/28/2015   HCT 34.6* 10/28/2015   MCV 94.5 10/28/2015   PLT 208 10/28/2015      Component Value Date/Time   NA 138 10/27/2015 0639   K 3.4* 10/27/2015 0639   CL 106 10/27/2015 0639   CO2 24 10/27/2015 0639   GLUCOSE 96 10/27/2015 0639   BUN 30* 10/27/2015 0639   CREATININE 1.58* 10/27/2015 0639   CALCIUM 8.9 10/27/2015 0639   PROT 6.2* 10/26/2015 1520   ALBUMIN 3.5 10/26/2015 1520  AST 20 10/26/2015 1520   ALT 11* 10/26/2015 1520   ALKPHOS 46 10/26/2015 1520   BILITOT 0.7 10/26/2015 1520   GFRNONAA 27* 10/27/2015 0639   GFRAA 31* 10/27/2015 0639   Lab Results  Component Value Date   CHOL 198 10/27/2015   HDL 58 10/27/2015   LDLCALC 128* 10/27/2015   TRIG 61 10/27/2015   CHOLHDL 3.4 10/27/2015   Lab Results  Component Value Date   HGBA1C 5.8* 10/27/2015   No results found for: VITAMINB12 Lab Results  Component Value Date   TSH 2.755 10/27/2015      ASSESSMENT AND PLAN 82 y.o. year old female  has a past medical history of Hypertension; Thyroid disease; Bundle branch block, left; Pernicious anemia; Osteoarthritis; B12 deficiency (10/26/2015); Hypothyroidism (10/26/2015); CKD (chronic kidney  disease), stage IV (HCC) (10/26/2015); and Stroke [facility]). here to follow-up for stroke.MRI of the brain with acute nonhemorrhagic small moderate-sized infarct posterior right lenticular nucleus extending to posterior right corona radiata with involvement of the right external capsule. No intracranial hemorrhage. Very mild small vessel disease changes. Mild global atrophy without hydrocephalus. 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 Carotid Doppler no significant stenosis. 2-D echo 55-60% EF. LDL 128. Hemoglobin A1c 5.8.   PLAN: Continue Plavix for secondary stroke prevention  Continue Lipitor for secondary prevention, lipids followed by PCP  Blood pressure mildly elevated 151/82  continue meds Continue exercises recommended by physical therapy Healthy diet with fresh fruits and vegetables Follow up in 6 months Nilda Riggs, [facility], [facility], APRN  [facility] [address], [address] [place], [place] [address] [phone]

## 2016-03-06 NOTE — Progress Notes (Signed)
I reviewed above note and agree with the assessment and plan.  Marvel PlanJindong Meelah Tallo, MD PhD Stroke Neurology 03/06/2016 5:48 PM

## 2016-03-06 NOTE — Patient Instructions (Addendum)
  Continue Plavix for secondary stroke prevention  Continue Lipitor for secondary prevention, lipids followed by PCP  Blood pressure mildly elevated 151/82  continue meds Continue exercises recommended by physical therapy Healthy diet with fresh fruits and vegetables Follow up in 6 months

## 2016-05-31 DIAGNOSIS — I10 Essential (primary) hypertension: Secondary | ICD-10-CM | POA: Diagnosis not present

## 2016-05-31 DIAGNOSIS — E782 Mixed hyperlipidemia: Secondary | ICD-10-CM | POA: Diagnosis not present

## 2016-06-07 DIAGNOSIS — N184 Chronic kidney disease, stage 4 (severe): Secondary | ICD-10-CM | POA: Diagnosis not present

## 2016-06-07 DIAGNOSIS — R7309 Other abnormal glucose: Secondary | ICD-10-CM | POA: Diagnosis not present

## 2016-06-07 DIAGNOSIS — Z23 Encounter for immunization: Secondary | ICD-10-CM | POA: Diagnosis not present

## 2016-06-07 DIAGNOSIS — D510 Vitamin B12 deficiency anemia due to intrinsic factor deficiency: Secondary | ICD-10-CM | POA: Diagnosis not present

## 2016-06-07 DIAGNOSIS — E782 Mixed hyperlipidemia: Secondary | ICD-10-CM | POA: Diagnosis not present

## 2016-07-05 ENCOUNTER — Telehealth: Payer: Self-pay | Admitting: Nurse Practitioner

## 2016-07-05 NOTE — Telephone Encounter (Signed)
Pt's daughter advised Dr Willa Roughamashandra is going to be handling her care from now on. The pt said he does the same as the clinic and she will just see him from now on.  FYI

## 2016-08-22 DIAGNOSIS — H401133 Primary open-angle glaucoma, bilateral, severe stage: Secondary | ICD-10-CM | POA: Diagnosis not present

## 2016-08-22 DIAGNOSIS — H04123 Dry eye syndrome of bilateral lacrimal glands: Secondary | ICD-10-CM | POA: Diagnosis not present

## 2016-09-06 ENCOUNTER — Ambulatory Visit: Payer: Medicare Other | Admitting: Nurse Practitioner

## 2016-10-22 ENCOUNTER — Telehealth: Payer: Self-pay | Admitting: *Deleted

## 2016-10-22 NOTE — Telephone Encounter (Signed)
[facility] on clopidogrel. Noted that pcp, Dr. Willa Roughamashandra is handling pts care from now on per daughter.    Faxed to his office. [phone].  (confirmation received).

## 2016-10-22 NOTE — Telephone Encounter (Signed)
Message noted.

## 2016-12-04 DIAGNOSIS — Z961 Presence of intraocular lens: Secondary | ICD-10-CM | POA: Diagnosis not present

## 2016-12-04 DIAGNOSIS — H401133 Primary open-angle glaucoma, bilateral, severe stage: Secondary | ICD-10-CM | POA: Diagnosis not present

## 2016-12-04 DIAGNOSIS — H04123 Dry eye syndrome of bilateral lacrimal glands: Secondary | ICD-10-CM | POA: Diagnosis not present

## 2016-12-04 DIAGNOSIS — H353132 Nonexudative age-related macular degeneration, bilateral, intermediate dry stage: Secondary | ICD-10-CM | POA: Diagnosis not present

## 2016-12-13 DIAGNOSIS — N390 Urinary tract infection, site not specified: Secondary | ICD-10-CM | POA: Diagnosis not present

## 2016-12-13 DIAGNOSIS — R7309 Other abnormal glucose: Secondary | ICD-10-CM | POA: Diagnosis not present

## 2016-12-13 DIAGNOSIS — Z0000 Encounter for general adult medical examination without abnormal findings: Secondary | ICD-10-CM | POA: Diagnosis not present

## 2016-12-13 DIAGNOSIS — Z780 Asymptomatic menopausal state: Secondary | ICD-10-CM | POA: Diagnosis not present

## 2016-12-13 DIAGNOSIS — I10 Essential (primary) hypertension: Secondary | ICD-10-CM | POA: Diagnosis not present

## 2016-12-13 DIAGNOSIS — E782 Mixed hyperlipidemia: Secondary | ICD-10-CM | POA: Diagnosis not present

## 2016-12-20 DIAGNOSIS — R7309 Other abnormal glucose: Secondary | ICD-10-CM | POA: Diagnosis not present

## 2016-12-20 DIAGNOSIS — I129 Hypertensive chronic kidney disease with stage 1 through stage 4 chronic kidney disease, or unspecified chronic kidney disease: Secondary | ICD-10-CM | POA: Diagnosis not present

## 2016-12-20 DIAGNOSIS — E782 Mixed hyperlipidemia: Secondary | ICD-10-CM | POA: Diagnosis not present

## 2016-12-20 DIAGNOSIS — Z23 Encounter for immunization: Secondary | ICD-10-CM | POA: Diagnosis not present

## 2016-12-20 DIAGNOSIS — N184 Chronic kidney disease, stage 4 (severe): Secondary | ICD-10-CM | POA: Diagnosis not present

## 2017-06-05 DIAGNOSIS — H401133 Primary open-angle glaucoma, bilateral, severe stage: Secondary | ICD-10-CM | POA: Diagnosis not present

## 2017-06-05 DIAGNOSIS — H04123 Dry eye syndrome of bilateral lacrimal glands: Secondary | ICD-10-CM | POA: Diagnosis not present

## 2017-06-06 DIAGNOSIS — E782 Mixed hyperlipidemia: Secondary | ICD-10-CM | POA: Diagnosis not present

## 2017-06-06 DIAGNOSIS — E039 Hypothyroidism, unspecified: Secondary | ICD-10-CM | POA: Diagnosis not present

## 2017-06-06 DIAGNOSIS — R7309 Other abnormal glucose: Secondary | ICD-10-CM | POA: Diagnosis not present

## 2017-06-06 DIAGNOSIS — Z23 Encounter for immunization: Secondary | ICD-10-CM | POA: Diagnosis not present

## 2017-06-13 DIAGNOSIS — R7309 Other abnormal glucose: Secondary | ICD-10-CM | POA: Diagnosis not present

## 2017-06-13 DIAGNOSIS — N184 Chronic kidney disease, stage 4 (severe): Secondary | ICD-10-CM | POA: Diagnosis not present

## 2017-06-13 DIAGNOSIS — I129 Hypertensive chronic kidney disease with stage 1 through stage 4 chronic kidney disease, or unspecified chronic kidney disease: Secondary | ICD-10-CM | POA: Diagnosis not present

## 2017-06-13 DIAGNOSIS — E782 Mixed hyperlipidemia: Secondary | ICD-10-CM | POA: Diagnosis not present

## 2017-06-13 DIAGNOSIS — E039 Hypothyroidism, unspecified: Secondary | ICD-10-CM | POA: Diagnosis not present

## 2017-09-25 ENCOUNTER — Inpatient Hospital Stay (HOSPITAL_COMMUNITY)
Admission: EM | Admit: 2017-09-25 | Discharge: 2017-09-27 | DRG: 948 | Disposition: A | Discharge status: Home Health | Payer: Medicare Other | Attending: Internal Medicine | Admitting: Internal Medicine

## 2017-09-25 ENCOUNTER — Other Ambulatory Visit: Payer: Self-pay

## 2017-09-25 ENCOUNTER — Emergency Department (HOSPITAL_COMMUNITY): Payer: Medicare Other

## 2017-09-25 DIAGNOSIS — R7989 Other specified abnormal findings of blood chemistry: Secondary | ICD-10-CM | POA: Diagnosis present

## 2017-09-25 DIAGNOSIS — I129 Hypertensive chronic kidney disease with stage 1 through stage 4 chronic kidney disease, or unspecified chronic kidney disease: Secondary | ICD-10-CM | POA: Diagnosis present

## 2017-09-25 DIAGNOSIS — I447 Left bundle-branch block, unspecified: Secondary | ICD-10-CM | POA: Diagnosis not present

## 2017-09-25 DIAGNOSIS — I429 Cardiomyopathy, unspecified: Secondary | ICD-10-CM | POA: Diagnosis not present

## 2017-09-25 DIAGNOSIS — I6529 Occlusion and stenosis of unspecified carotid artery: Secondary | ICD-10-CM | POA: Diagnosis present

## 2017-09-25 DIAGNOSIS — N184 Chronic kidney disease, stage 4 (severe): Secondary | ICD-10-CM | POA: Diagnosis present

## 2017-09-25 DIAGNOSIS — M25551 Pain in right hip: Secondary | ICD-10-CM | POA: Diagnosis present

## 2017-09-25 DIAGNOSIS — Z66 Do not resuscitate: Secondary | ICD-10-CM | POA: Diagnosis present

## 2017-09-25 DIAGNOSIS — R8271 Bacteriuria: Secondary | ICD-10-CM | POA: Diagnosis present

## 2017-09-25 DIAGNOSIS — E039 Hypothyroidism, unspecified: Secondary | ICD-10-CM | POA: Diagnosis present

## 2017-09-25 DIAGNOSIS — R200 Anesthesia of skin: Secondary | ICD-10-CM | POA: Diagnosis present

## 2017-09-25 DIAGNOSIS — M1990 Unspecified osteoarthritis, unspecified site: Secondary | ICD-10-CM | POA: Diagnosis present

## 2017-09-25 DIAGNOSIS — Z96642 Presence of left artificial hip joint: Secondary | ICD-10-CM | POA: Diagnosis present

## 2017-09-25 DIAGNOSIS — Z7902 Long term (current) use of antithrombotics/antiplatelets: Secondary | ICD-10-CM

## 2017-09-25 DIAGNOSIS — I10 Essential (primary) hypertension: Secondary | ICD-10-CM | POA: Diagnosis present

## 2017-09-25 DIAGNOSIS — R778 Other specified abnormalities of plasma proteins: Secondary | ICD-10-CM | POA: Diagnosis present

## 2017-09-25 DIAGNOSIS — D631 Anemia in chronic kidney disease: Secondary | ICD-10-CM | POA: Diagnosis present

## 2017-09-25 DIAGNOSIS — R29898 Other symptoms and signs involving the musculoskeletal system: Secondary | ICD-10-CM | POA: Diagnosis present

## 2017-09-25 DIAGNOSIS — R531 Weakness: Principal | ICD-10-CM | POA: Diagnosis present

## 2017-09-25 DIAGNOSIS — Z79899 Other long term (current) drug therapy: Secondary | ICD-10-CM

## 2017-09-25 DIAGNOSIS — Z8673 Personal history of transient ischemic attack (TIA), and cerebral infarction without residual deficits: Secondary | ICD-10-CM

## 2017-09-25 DIAGNOSIS — R404 Transient alteration of awareness: Secondary | ICD-10-CM | POA: Diagnosis not present

## 2017-09-25 DIAGNOSIS — M25559 Pain in unspecified hip: Secondary | ICD-10-CM

## 2017-09-25 NOTE — ED Triage Notes (Signed)
Pt arrived via [facility] after family called for new onset weakness noted with pt at approx 13:30 today. Per EMS, pt began feeling weak while walking with her walker before calling out for help from her family. Family lowered pt to floor without incident. Pt denies any trauma, falls, or impact. Pt deies pain at time of triage. Pt is alert and oriented x4.

## 2017-09-26 ENCOUNTER — Other Ambulatory Visit: Payer: Self-pay

## 2017-09-26 ENCOUNTER — Observation Stay (HOSPITAL_BASED_OUTPATIENT_CLINIC_OR_DEPARTMENT_OTHER): Payer: Medicare Other

## 2017-09-26 ENCOUNTER — Encounter (HOSPITAL_COMMUNITY): Payer: Self-pay | Admitting: Radiology

## 2017-09-26 ENCOUNTER — Observation Stay (HOSPITAL_BASED_OUTPATIENT_CLINIC_OR_DEPARTMENT_OTHER)
Admit: 2017-09-26 | Discharge: 2017-09-26 | Disposition: A | Discharge status: Home / Self Care | Payer: Medicare Other | Attending: Family Medicine | Admitting: Family Medicine

## 2017-09-26 ENCOUNTER — Observation Stay (HOSPITAL_COMMUNITY): Payer: Medicare Other

## 2017-09-26 ENCOUNTER — Emergency Department (HOSPITAL_COMMUNITY): Payer: Medicare Other

## 2017-09-26 ENCOUNTER — Other Ambulatory Visit (HOSPITAL_COMMUNITY): Payer: Self-pay

## 2017-09-26 DIAGNOSIS — R200 Anesthesia of skin: Secondary | ICD-10-CM

## 2017-09-26 DIAGNOSIS — E039 Hypothyroidism, unspecified: Secondary | ICD-10-CM

## 2017-09-26 DIAGNOSIS — R531 Weakness: Secondary | ICD-10-CM | POA: Diagnosis not present

## 2017-09-26 DIAGNOSIS — I447 Left bundle-branch block, unspecified: Secondary | ICD-10-CM | POA: Diagnosis present

## 2017-09-26 DIAGNOSIS — R29898 Other symptoms and signs involving the musculoskeletal system: Secondary | ICD-10-CM | POA: Diagnosis not present

## 2017-09-26 DIAGNOSIS — Z79899 Other long term (current) drug therapy: Secondary | ICD-10-CM | POA: Diagnosis not present

## 2017-09-26 DIAGNOSIS — Z7902 Long term (current) use of antithrombotics/antiplatelets: Secondary | ICD-10-CM | POA: Diagnosis not present

## 2017-09-26 DIAGNOSIS — I129 Hypertensive chronic kidney disease with stage 1 through stage 4 chronic kidney disease, or unspecified chronic kidney disease: Secondary | ICD-10-CM | POA: Diagnosis present

## 2017-09-26 DIAGNOSIS — I429 Cardiomyopathy, unspecified: Secondary | ICD-10-CM | POA: Diagnosis present

## 2017-09-26 DIAGNOSIS — Z8673 Personal history of transient ischemic attack (TIA), and cerebral infarction without residual deficits: Secondary | ICD-10-CM

## 2017-09-26 DIAGNOSIS — I10 Essential (primary) hypertension: Secondary | ICD-10-CM | POA: Diagnosis not present

## 2017-09-26 DIAGNOSIS — I340 Nonrheumatic mitral (valve) insufficiency: Secondary | ICD-10-CM | POA: Diagnosis not present

## 2017-09-26 DIAGNOSIS — D631 Anemia in chronic kidney disease: Secondary | ICD-10-CM | POA: Diagnosis present

## 2017-09-26 DIAGNOSIS — M1990 Unspecified osteoarthritis, unspecified site: Secondary | ICD-10-CM | POA: Diagnosis present

## 2017-09-26 DIAGNOSIS — Z66 Do not resuscitate: Secondary | ICD-10-CM | POA: Diagnosis present

## 2017-09-26 DIAGNOSIS — N184 Chronic kidney disease, stage 4 (severe): Secondary | ICD-10-CM | POA: Diagnosis present

## 2017-09-26 DIAGNOSIS — M25551 Pain in right hip: Secondary | ICD-10-CM

## 2017-09-26 DIAGNOSIS — R8271 Bacteriuria: Secondary | ICD-10-CM | POA: Diagnosis present

## 2017-09-26 DIAGNOSIS — R748 Abnormal levels of other serum enzymes: Secondary | ICD-10-CM | POA: Diagnosis not present

## 2017-09-26 DIAGNOSIS — Z96642 Presence of left artificial hip joint: Secondary | ICD-10-CM | POA: Diagnosis present

## 2017-09-26 DIAGNOSIS — I6529 Occlusion and stenosis of unspecified carotid artery: Secondary | ICD-10-CM | POA: Diagnosis present

## 2017-09-26 LAB — COMPREHENSIVE METABOLIC PANEL
ALBUMIN: 3.3 g/dL — AB (ref 3.5–5.0)
ALT: 10 U/L — ABNORMAL LOW (ref 14–54)
ANION GAP: 10 (ref 5–15)
AST: 16 U/L (ref 15–41)
Alkaline Phosphatase: 55 U/L (ref 38–126)
BILIRUBIN TOTAL: 0.9 mg/dL (ref 0.3–1.2)
BUN: 29 mg/dL — AB (ref 6–20)
CHLORIDE: 102 mmol/L (ref 101–111)
CO2: 26 mmol/L (ref 22–32)
Calcium: 9.2 mg/dL (ref 8.9–10.3)
Creatinine, Ser: 1.48 mg/dL — ABNORMAL HIGH (ref 0.44–1.00)
GFR calc Af Amer: 33 mL/min — ABNORMAL LOW (ref >60)
GFR calc non Af Amer: 28 mL/min — ABNORMAL LOW (ref >60)
GLUCOSE: 108 mg/dL — AB (ref 65–99)
POTASSIUM: 4 mmol/L (ref 3.5–5.1)
SODIUM: 138 mmol/L (ref 135–145)
TOTAL PROTEIN: 5.6 g/dL — AB (ref 6.5–8.1)

## 2017-09-26 LAB — URINALYSIS, ROUTINE W REFLEX MICROSCOPIC
BILIRUBIN URINE: NEGATIVE
Glucose, UA: NEGATIVE mg/dL
Ketones, ur: NEGATIVE mg/dL
NITRITE: NEGATIVE
PROTEIN: NEGATIVE mg/dL
Specific Gravity, Urine: 1.012 (ref 1.005–1.030)
pH: 8 (ref 5.0–8.0)

## 2017-09-26 LAB — CBC
HCT: 34.5 % — ABNORMAL LOW (ref 36.0–46.0)
HEMOGLOBIN: 11.3 g/dL — AB (ref 12.0–15.0)
MCH: 31.3 pg (ref 26.0–34.0)
MCHC: 32.8 g/dL (ref 30.0–36.0)
MCV: 95.6 fL (ref 78.0–100.0)
PLATELETS: 169 10*3/uL (ref 150–400)
RBC: 3.61 MIL/uL — ABNORMAL LOW (ref 3.87–5.11)
RDW: 13.5 % (ref 11.5–15.5)
WBC: 8.9 10*3/uL (ref 4.0–10.5)

## 2017-09-26 LAB — DIFFERENTIAL
BASOS ABS: 0 10*3/uL (ref 0.0–0.1)
BASOS PCT: 0 %
EOS ABS: 0.3 10*3/uL (ref 0.0–0.7)
Eosinophils Relative: 4 %
LYMPHS ABS: 2.4 10*3/uL (ref 0.7–4.0)
Lymphocytes Relative: 27 %
Monocytes Absolute: 0.8 10*3/uL (ref 0.1–1.0)
Monocytes Relative: 9 %
NEUTROS PCT: 60 %
Neutro Abs: 5.4 10*3/uL (ref 1.7–7.7)

## 2017-09-26 LAB — PROTIME-INR
INR: 1.05
PROTHROMBIN TIME: 13.6 s (ref 11.4–15.2)

## 2017-09-26 LAB — RAPID URINE DRUG SCREEN, HOSP PERFORMED
AMPHETAMINES: NOT DETECTED
Barbiturates: NOT DETECTED
Benzodiazepines: NOT DETECTED
COCAINE: NOT DETECTED
OPIATES: NOT DETECTED
TETRAHYDROCANNABINOL: NOT DETECTED

## 2017-09-26 LAB — LIPID PANEL
CHOL/HDL RATIO: 2.1 ratio
Cholesterol: 173 mg/dL (ref 0–200)
HDL: 84 mg/dL (ref >40)
LDL Cholesterol: 79 mg/dL (ref 0–99)
Triglycerides: 48 mg/dL (ref <150)
VLDL: 10 mg/dL (ref 0–40)

## 2017-09-26 LAB — HEMOGLOBIN A1C
HEMOGLOBIN A1C: 5.9 % — AB (ref 4.8–5.6)
MEAN PLASMA GLUCOSE: 122.63 mg/dL

## 2017-09-26 LAB — ECHOCARDIOGRAM COMPLETE

## 2017-09-26 LAB — APTT: APTT: 41 s — AB (ref 24–36)

## 2017-09-26 LAB — I-STAT TROPONIN, ED: Troponin i, poc: 0.16 ng/mL (ref 0.00–0.08)

## 2017-09-26 LAB — ETHANOL: Alcohol, Ethyl (B): <10 mg/dL (ref <10)

## 2017-09-26 MED ORDER — SODIUM CHLORIDE 0.9 % IV SOLN
INTRAVENOUS | Status: AC
  Administered 2017-09-26: 03:00:00 via INTRAVENOUS

## 2017-09-26 MED ORDER — CLOPIDOGREL BISULFATE 75 MG PO TABS
75.0000 mg | ORAL_TABLET | Freq: Every day | ORAL | Status: DC
  Administered 2017-09-27: 75 mg via ORAL
  Filled 2017-09-26: qty 1

## 2017-09-26 MED ORDER — PERFLUTREN LIPID MICROSPHERE
1.0000 mL | INTRAVENOUS | Status: AC | PRN
  Administered 2017-09-26: 2 mL via INTRAVENOUS
  Filled 2017-09-26: qty 10

## 2017-09-26 MED ORDER — ACETAMINOPHEN 160 MG/5ML PO SOLN: 650.0000 mg | ORAL | Status: DC | PRN

## 2017-09-26 MED ORDER — DORZOLAMIDE HCL-TIMOLOL MAL 2-0.5 % OP SOLN
1.0000 [drp] | Freq: Two times a day (BID) | OPHTHALMIC | Status: DC
  Administered 2017-09-26 – 2017-09-27 (×2): 1 [drp] via OPHTHALMIC
  Filled 2017-09-26 (×2): qty 10

## 2017-09-26 MED ORDER — HEPARIN SODIUM (PORCINE) 5000 UNIT/ML IJ SOLN
5000.0000 [IU] | Freq: Three times a day (TID) | INTRAMUSCULAR | Status: DC
  Administered 2017-09-26 – 2017-09-27 (×5): 5000 [IU] via SUBCUTANEOUS
  Filled 2017-09-26 (×5): qty 1

## 2017-09-26 MED ORDER — ACETAMINOPHEN 650 MG RE SUPP: 650.0000 mg | RECTAL | Status: DC | PRN

## 2017-09-26 MED ORDER — SENNOSIDES-DOCUSATE SODIUM 8.6-50 MG PO TABS: 1.0000 | ORAL_TABLET | Freq: Every evening | ORAL | Status: DC | PRN

## 2017-09-26 MED ORDER — AMLODIPINE BESYLATE 10 MG PO TABS
10.0000 mg | ORAL_TABLET | Freq: Every day | ORAL | Status: DC
  Administered 2017-09-27: 10 mg via ORAL
  Filled 2017-09-26: qty 1

## 2017-09-26 MED ORDER — SODIUM CHLORIDE 0.9 % IV SOLN
INTRAVENOUS | Status: AC
  Administered 2017-09-26: 17:00:00 via INTRAVENOUS

## 2017-09-26 MED ORDER — ADULT MULTIVITAMIN W/MINERALS CH
1.0000 | ORAL_TABLET | Freq: Every day | ORAL | Status: DC
  Administered 2017-09-27: 1 via ORAL
  Filled 2017-09-26: qty 1

## 2017-09-26 MED ORDER — ATORVASTATIN CALCIUM 10 MG PO TABS
10.0000 mg | ORAL_TABLET | Freq: Every day | ORAL | Status: DC
  Administered 2017-09-26: 10 mg via ORAL
  Filled 2017-09-26: qty 1

## 2017-09-26 MED ORDER — LORAZEPAM 2 MG/ML IJ SOLN: 0.5000 mg | INTRAMUSCULAR | Status: DC | PRN

## 2017-09-26 MED ORDER — ACETAMINOPHEN 325 MG PO TABS: 650.0000 mg | ORAL_TABLET | ORAL | Status: DC | PRN

## 2017-09-26 MED ORDER — DICLOFENAC SODIUM 1 % TD GEL
2.0000 g | Freq: Four times a day (QID) | TRANSDERMAL | Status: DC
  Administered 2017-09-26 – 2017-09-27 (×3): 2 g via TOPICAL
  Filled 2017-09-26: qty 100

## 2017-09-26 MED ORDER — LEVOTHYROXINE SODIUM 112 MCG PO TABS
112.0000 ug | ORAL_TABLET | Freq: Every day | ORAL | Status: DC
  Administered 2017-09-26 – 2017-09-27 (×2): 112 ug via ORAL
  Filled 2017-09-26 (×3): qty 1

## 2017-09-26 MED ORDER — VITAMIN D 1000 UNITS PO TABS
2000.0000 [IU] | ORAL_TABLET | Freq: Every day | ORAL | Status: DC
  Administered 2017-09-27: 2000 [IU] via ORAL
  Filled 2017-09-26: qty 2

## 2017-09-26 MED ORDER — STROKE: EARLY STAGES OF RECOVERY BOOK
Freq: Once | Status: DC
  Filled 2017-09-26 (×2): qty 1

## 2017-09-26 MED ORDER — LATANOPROST 0.005 % OP SOLN
1.0000 [drp] | Freq: Every day | OPHTHALMIC | Status: DC
  Administered 2017-09-26: 1 [drp] via OPHTHALMIC
  Filled 2017-09-26: qty 2.5

## 2017-09-26 NOTE — ED Notes (Signed)
Regular Diet Ordered For Dinner. 

## 2017-09-26 NOTE — ED Notes (Signed)
Pt is aware that a urine is needed. 

## 2017-09-26 NOTE — ED Notes (Signed)
Pt. States feeling changes in right leg.

## 2017-09-26 NOTE — ED Notes (Signed)
Patient transported to X-ray 

## 2017-09-26 NOTE — ED Notes (Signed)
Pt sitting up in bed eating breakfast

## 2017-09-26 NOTE — ED Notes (Signed)
Bladder scan 0mL.

## 2017-09-26 NOTE — ED Notes (Signed)
Report given to accepting RN on [facility]

## 2017-09-26 NOTE — ED Notes (Signed)
Per Neuro PA, leave pt NPO until speech therapy consult. Pt had difficulty eating breakfast despite passing her stroke swallow screen.

## 2017-09-26 NOTE — Progress Notes (Signed)
  Echocardiogram 2D Echocardiogram has been performed.  Jennifer Pittman 09/26/2017, 9:34 AM

## 2017-09-26 NOTE — H&P (Signed)
History and Physical    Jennifer Pittman WUJ:[id number] DOB: [birth year] DOA: 09/25/2017  PCP: Georgianne Fick, MD   Patient coming from: Home  Chief Complaint: Acute weakness   HPI: Jennifer Pittman is a 84 y.o. female with medical history significant for hypothyroidism, hypertension, history of CVA, now presenting to the emergency department for evaluation of acute weakness.  Patient reports that she had been in her usual state of health and was having an uneventful day when she attempted to ambulate to the restroom using her walker as she usually does.  This was at approximately 13:30 on 09/25/2017, and she experienced acute numbness and weakness involving the right lower extremity, felt as though she would fall secondary to this, called out to her son who was nearby, and he helped her down to the ground as she was beginning to fall.  She reports that she may have had some upper extremity weakness bilaterally at that time also, but that has resolved whereas the right lower extremity numbness and weakness persists.  She denies any headache, chest pain, palpitations, change in vision or hearing, or confusion.  Denies recent fevers or chills.  ED Course: Upon arrival to the ED, patient is found to be afebrile, saturating low 90s on room air and with vitals otherwise normal.  EKG features a sinus rhythm with chronic LBBB.  Noncontrast head CT is negative for acute intracranial abnormality.  Chemistry panel reveals a serum creatinine 1.48, consistent with her apparent baseline.  CBC is notable for a chronic mild normocytic anemia with hemoglobin of 11.3.  Troponin is elevated to 0.16, similar to priors.  Patient remains hemodynamically stable, in no apparent respiratory distress, and has persistent right lower extremity numbness and weakness.  She will be observed on the telemetry unit for ongoing evaluation and management of possible acute ischemic CVA.  Review of Systems:  All other systems  reviewed and apart from HPI, are negative.  Past Medical History:  Diagnosis Date  . B12 deficiency 10/26/2015  . Bundle branch block, left   . CKD (chronic kidney disease), stage IV (HCC) 10/26/2015  . Hypertension   . Hypothyroidism 10/26/2015  . Osteoarthritis   . Pernicious anemia   . Stroke (HCC)   . Thyroid disease     Past Surgical History:  Procedure Laterality Date  . ABDOMINAL HYSTERECTOMY    . hyterectomy    . TOTAL HIP ARTHROPLASTY Left      reports that  has never smoked. she has never used smokeless tobacco. She reports that she does not drink alcohol or use drugs.  No Known Allergies  History reviewed. No pertinent family history.   Prior to Admission medications   Medication Sig Start Date End Date Taking? Authorizing Provider  amLODipine (NORVASC) 10 MG tablet Take 10 mg by mouth daily.    [provider]  atorvastatin (LIPITOR) 10 MG tablet Take 1 tablet (10 mg total) by mouth daily at 6 PM. 10/29/15   [name], MD  Cholecalciferol (VITAMIN D) 2000 units tablet Take 2,000 Units by mouth daily.    [provider]  clopidogrel (PLAVIX) 75 MG tablet Take 1 tablet (75 mg total) by mouth daily. 03/06/16   Nilda Riggs, NP  Cyanocobalamin (B-12 IJ) Inject 1,000 mcg as directed every 30 (thirty) days.    [provider]  dorzolamide-timolol (COSOPT) 22.3-6.8 MG/ML ophthalmic solution Place 1 drop into both eyes 2 (two) times daily. 09/06/15   [provider]  hydrochlorothiazide (HYDRODIURIL) 50  MG tablet Take 50 mg by mouth daily.    [provider]  latanoprost (XALATAN) 0.005 % ophthalmic solution Place 1 drop into both eyes at bedtime. 09/07/15   [provider]  levothyroxine (SYNTHROID, LEVOTHROID) 112 MCG tablet Take 112 mcg by mouth daily before breakfast. 09/06/15   [provider]  Multiple Vitamin (MULTIVITAMIN WITH MINERALS) TABS tablet Take 1 tablet by mouth daily.    [provider]  telmisartan (MICARDIS) 20 MG tablet Take 20 mg by mouth daily.    [provider]    Physical Exam: Vitals:   09/26/17 0030 09/26/17 0041 09/26/17 0045 09/26/17 0145  BP: 139/60  (!) 174/81 (!) 150/61  Pulse: (!) 51  67 (!) 53  Resp: 12  (!) 24 11  Temp:  98.5 F (36.9 C)    TempSrc:      SpO2: 99%  94% 98%      Constitutional: NAD, calm  Eyes: PERTLA, lids and conjunctivae normal ENMT: Mucous membranes are moist. Posterior pharynx clear of any exudate or lesions.   Neck: normal, supple, no masses, no thyromegaly Respiratory: clear to auscultation bilaterally, no wheezing, no crackles. Normal respiratory effort.    Cardiovascular: S1 & S2 heard, regular rate and rhythm. Trace pedal edema bilaterally. No significant JVD. Abdomen: No distension, no tenderness, no masses palpated. Bowel sounds normal.  Musculoskeletal: no clubbing / cyanosis. No joint deformity upper and lower extremities.    Skin: no significant rashes, lesions, ulcers. Warm, dry, well-perfused. Neurologic: CN 2-12 grossly intact. Sensation to light touch diminished in RLE. Proximal RLE weakness.  Psychiatric: Alert and oriented x 3. Pleasant and cooperative.     Labs on Admission: I have personally reviewed following labs and imaging studies  CBC: Recent Labs  Lab 09/26/17 0032  WBC 8.9  NEUTROABS 5.4  HGB 11.3*  HCT 34.5*  MCV 95.6  PLT 169   Basic Metabolic Panel: Recent Labs  Lab 09/26/17 0032  NA 138  K 4.0  CL 102  CO2 26  GLUCOSE 108*  BUN 29*  CREATININE 1.48*  CALCIUM 9.2   GFR: CrCl cannot be calculated (Unknown ideal weight.). Liver Function Tests: Recent Labs  Lab 09/26/17 0032  AST 16  ALT 10*  ALKPHOS 55  BILITOT 0.9  PROT 5.6*  ALBUMIN 3.3*   No results for input(s): LIPASE, AMYLASE in the last 168 hours. No results for input(s): AMMONIA in the last 168 hours. Coagulation Profile: Recent Labs  Lab 09/26/17 0032  INR 1.05   Cardiac Enzymes: No  results for input(s): CKTOTAL, CKMB, CKMBINDEX, TROPONINI in the last 168 hours. BNP (last 3 results) No results for input(s): PROBNP in the last 8760 hours. HbA1C: No results for input(s): HGBA1C in the last 72 hours. CBG: No results for input(s): GLUCAP in the last 168 hours. Lipid Profile: No results for input(s): CHOL, HDL, LDLCALC, TRIG, CHOLHDL, LDLDIRECT in the last 72 hours. Thyroid Function Tests: No results for input(s): TSH, T4TOTAL, FREET4, T3FREE, THYROIDAB in the last 72 hours. Anemia Panel: No results for input(s): VITAMINB12, FOLATE, FERRITIN, TIBC, IRON, RETICCTPCT in the last 72 hours. Urine analysis: No results found for: COLORURINE, APPEARANCEUR, LABSPEC, PHURINE, GLUCOSEU, HGBUR, BILIRUBINUR, KETONESUR, PROTEINUR, UROBILINOGEN, NITRITE, LEUKOCYTESUR Sepsis Labs: @LABRCNTIP (procalcitonin:4,lacticidven:4) )No results found for this or any previous visit (from the past 240 hour(s)).   Radiological Exams on Admission: Ct Head Wo Contrast  Result Date: 09/26/2017 CLINICAL DATA:  Generalize weakness. No head trauma. History of hypertension. Previous history of  stroke. EXAM: CT HEAD WITHOUT CONTRAST TECHNIQUE: Contiguous axial images were obtained from the base of the skull through the vertex without intravenous contrast. COMPARISON:  MRI brain 10/27/2015.  CT head 10/26/2015. FINDINGS: Brain: Diffuse cerebral atrophy. No ventricular dilatation. Low-attenuation changes in the deep white matter consistent with small vessel ischemia. No mass effect or midline shift. No abnormal extra-axial fluid collections. Gray-white matter junctions are distinct. Basal cisterns are not effaced. No acute intracranial hemorrhage. Vascular: Intracranial arterial vascular calcifications are present. Skull: Calvarium appears intact. Sinuses/Orbits: Paranasal sinuses and mastoid air cells are clear. Other: None. IMPRESSION: No acute intracranial abnormalities. Mild chronic atrophy and small vessel  ischemic changes for age. Electronically Signed   By: Burman Nieves M.D.   On: 09/26/2017 00:16    EKG: Independently reviewed. Sinus rhythm, chronic LBBB.   Assessment/Plan  1. Right leg weakness and numbness  - Presents with acute-onset of weakness and numbness involving the right leg; may have also had transient weakness in bilateral UE's - Right leg numbness and weakness persists in ED  - Head CT is negative for acute findings, labs stable, UA pending  - Concern for acute ischemic CVA not seen on CT  - Presents beyond timeframe for consideration of tPA  - Continue frequent neuro checks, PT/OT/SLP consults, cardiac monitoring  - Check MRI brain, MRA head, carotid dopplers, echocardiogram, fasting lipids, A1c  - Continue Plavix and Lipitor    2. Hx of CVA  - Concern for possible acute ischemic CVA as above  - Continue Lipitor and Plavix   3. CKD stage III  - SCr is 1.48 on admission, consistent with her apparent baseline  - Renally-dose medications, avoid nephrotoxins   4. Hypertension  - BP at goal  - Hold HCTZ, Norvasc, and telmisartan initially while evaluating for possible acute ischemic CVA    5. Hypothyroidism  - Continue Synthroid    6. Elevated troponin  - i-stat troponin elevated to 0.16 in ED  - No chest pain, nausea, diaphoresis, or SOB  - Chronic LBBB on EKG  - Continue cardiac monitoring, continue Plavix and Lipitor    DVT prophylaxis: sq heparin   Code Status: DNR Family Communication: Son updated at bedside Disposition Plan: Observe on telemetry Consults called: None Admission status: Observation     Briscoe Deutscher, MD [facility] Pager [phone]  If 7PM-7AM, please contact night-coverage [email or web address] Password [facility]  09/26/2017, 2:43 AM

## 2017-09-26 NOTE — ED Notes (Signed)
Patient transported to Echo.

## 2017-09-26 NOTE — ED Notes (Signed)
Pt. To XRAY via stretcher. 

## 2017-09-26 NOTE — Progress Notes (Signed)
VASCULAR LAB PRELIMINARY  PRELIMINARY  PRELIMINARY  PRELIMINARY  Carotid duplex completed.    Preliminary report:  1-39% ICA stenosis.  Vertebral artery flow is antegrade.   Nekayla Heider, RVT 09/26/2017, 9:31 AM

## 2017-09-26 NOTE — Consult Note (Signed)
Requesting Physician: Dr. Waymon Amato, A D    Chief Complaint: Right leg weakness  History obtained from:  Patient     HPI:                                                                                                                                         Jennifer Pittman is an 84 y.o. female history of hypertension, chronic kidney disease, B12 deficiency, stroke in the past.  Patient at baseline walks with a walker, states that she has gone to physical therapy.  Apparently yesterday at approximately 12:00 to possibly 1330 she had a sudden onset of numbness in the right leg in addition to weakness.  She was where she was in a fall so she called her son who lives nearby and he came and helped her slowly go to the ground as at that time she thought she was going to fall.  She states that she is also been having upper extremity weakness for some time which is bilateral but feels this is gotten better.  Neurology was asked to follow-up and see the patient for her right leg numbness and weakness.  Of note MRI of the brain was negative.  Date last known well: Date: 09/25/2017 Time last known well: Time: 12:00 tPA Given: No: Out of the window NIH stroke scale 3 secondary to bilateral leg weakness and drift  Modified Rankin: Rankin Score=3    Past Medical History:  Diagnosis Date  . B12 deficiency 10/26/2015  . Bundle branch block, left   . CKD (chronic kidney disease), stage IV (HCC) 10/26/2015  . Hypertension   . Hypothyroidism 10/26/2015  . Osteoarthritis   . Pernicious anemia   . Stroke (HCC)   . Thyroid disease     Past Surgical History:  Procedure Laterality Date  . ABDOMINAL HYSTERECTOMY    . hyterectomy    . TOTAL HIP ARTHROPLASTY Left     History reviewed. No pertinent family history. Social History:  reports that  has never smoked. she has never used smokeless tobacco. She reports that she does not drink alcohol or use drugs.  Allergies: No Known Allergies  Medications:                                                                                                                           Current Facility-Administered Medications  Medication Dose Route Frequency Provider Last Rate Last Dose  .  stroke: mapping our early stages of recovery book   Does not apply Once Opyd, Timothy S, MD      . 0.9 %  sodium chloride infusion   Intravenous Continuous Opyd, Lavone Neri, MD 75 mL/hr at 09/26/17 0754    . acetaminophen (TYLENOL) tablet 650 mg  650 mg Oral Q4H PRN Opyd, Lavone Neri, MD       Or  . acetaminophen (TYLENOL) solution 650 mg  650 mg Per Tube Q4H PRN Opyd, Lavone Neri, MD       Or  . acetaminophen (TYLENOL) suppository 650 mg  650 mg Rectal Q4H PRN Opyd, Lavone Neri, MD      . atorvastatin (LIPITOR) tablet 10 mg  10 mg Oral q1800 Opyd, Lavone Neri, MD      . cholecalciferol (VITAMIN D) tablet 2,000 Units  2,000 Units Oral Daily Opyd, Lavone Neri, MD   Stopped at 09/26/17 [phone]  . clopidogrel (PLAVIX) tablet 75 mg  75 mg Oral Daily Opyd, Lavone Neri, MD   Stopped at 09/26/17 [phone]  . dorzolamide-timolol (COSOPT) 22.3-6.8 MG/ML ophthalmic solution 1 drop  1 drop Both Eyes BID Opyd, Lavone Neri, MD   Stopped at 09/26/17 0957  . heparin injection 5,000 Units  5,000 Units Subcutaneous Q8H Opyd, Lavone Neri, MD   5,000 Units at 09/26/17 0701  . latanoprost (XALATAN) 0.005 % ophthalmic solution 1 drop  1 drop Both Eyes QHS Opyd, Timothy S, MD      . levothyroxine (SYNTHROID, LEVOTHROID) tablet 112 mcg  112 mcg Oral QAC breakfast Opyd, Lavone Neri, MD   112 mcg at 09/26/17 0754  . LORazepam (ATIVAN) injection 0.5 mg  0.5 mg Intravenous Q4H PRN Opyd, Lavone Neri, MD      . multivitamin with minerals tablet 1 tablet  1 tablet Oral Daily Opyd, Lavone Neri, MD   Stopped at 09/26/17 0957  . perflutren lipid microspheres (DEFINITY) IV suspension  1-10 mL Intravenous PRN Opyd, Lavone Neri, MD   2 mL at 09/26/17 0845  . senna-docusate (Senokot-S) tablet 1 tablet  1 tablet Oral QHS PRN Opyd, Lavone Neri, MD        Current Outpatient Medications  Medication Sig Dispense Refill  . amLODipine (NORVASC) 10 MG tablet Take 10 mg by mouth daily.    Marland Kitchen atorvastatin (LIPITOR) 10 MG tablet Take 1 tablet (10 mg total) by mouth daily at 6 PM. 30 tablet 0  . Cholecalciferol (VITAMIN D) 2000 units tablet Take 2,000 Units by mouth daily.    . clopidogrel (PLAVIX) 75 MG tablet Take 1 tablet (75 mg total) by mouth daily. 30 tablet 6  . Cyanocobalamin (B-12 IJ) Inject 1,000 mcg as directed every 30 (thirty) days.    . dorzolamide-timolol (COSOPT) 22.3-6.8 MG/ML ophthalmic solution Place 1 drop into both eyes 2 (two) times daily.    . hydrochlorothiazide (HYDRODIURIL) 50 MG tablet Take 50 mg by mouth daily.    Marland Kitchen latanoprost (XALATAN) 0.005 % ophthalmic solution Place 1 drop into both eyes at bedtime.    Marland Kitchen levothyroxine (SYNTHROID, LEVOTHROID) 112 MCG tablet Take 112 mcg by mouth daily before breakfast.    . Multiple Vitamin (MULTIVITAMIN WITH MINERALS) TABS tablet Take 1 tablet by mouth daily.    Marland Kitchen telmisartan (MICARDIS) 20 MG tablet Take 20 mg by mouth daily.       ROS:  History obtained from the patient  General ROS: negative for - chills, fatigue, fever, night sweats, weight gain or weight loss Psychological ROS: negative for - , hallucinations, memory difficulties, mood swings or  Ophthalmic ROS: negative for - blurry vision, double vision, eye pain or loss of vision ENT ROS: negative for - epistaxis, nasal discharge, oral lesions, sore throat, tinnitus or vertigo Respiratory ROS: negative for - cough,  shortness of breath or wheezing Cardiovascular ROS: negative for - chest pain, dyspnea on exertion,  Gastrointestinal ROS: negative for - abdominal pain, diarrhea,  nausea/vomiting or stool incontinence Genito-Urinary ROS: negative for - dysuria, hematuria, incontinence or  urinary frequency/urgency Musculoskeletal ROS: negative for - joint swelling or muscular weakness Neurological ROS: as noted in HPI   General Examination:                                                                                                      Blood pressure (!) 154/73, pulse 60, temperature 98.5 F (36.9 C), resp. rate 16, SpO2 99 %.  HEENT-  Normocephalic, no lesions, without obvious abnormality.  Normal external eye and conjunctiva.   Cardiovascular- S1-S2 audible, pulses palpable throughout   Lungs-no rhonchi or wheezing noted, no excessive working breathing.  Saturations within normal limits Abdomen- All 4 quadrants palpated and nontender Extremities- Warm, dry and intact Musculoskeletal-no joint tenderness, deformity or swelling Skin-warm and dry, no hyperpigmentation, vitiligo, or suspicious lesions  Neurological Examination Mental Status: Alert, oriented, thought content appropriate.  Speech fluent without evidence of aphasia.  Able to follow 3 step commands without difficulty. Cranial Nerves: II:  Visual fields grossly normal,  III,IV, VI: ptosis not present, extra-ocular motions intact bilaterally, pupils equal, round, reactive to light and accommodation V,VII: smile symmetric, facial light touch sensation normal bilaterally VIII: hearing normal bilaterally IX,X: uvula rises symmetrically XI: bilateral shoulder shrug XII: midline tongue extension Motor: Right : Upper extremity   5/5    Left:     Upper extremity   5/5  Lower extremity   4/5     Lower extremity   5/5    Sensory: Pinprick and light touch intact throughout, bilaterally Deep Tendon Reflexes: diminished reflex throughout Plantars: Right: downgoing   Left: downgoing Cerebellar: normal finger-to-nose, heel to shin with left leg was fine was not able to get heel to shin with the right leg secondary to pain Gait: Gait was not tested   Lab Results: Basic Metabolic Panel: Recent Labs  Lab  09/26/17 0032  NA 138  K 4.0  CL 102  CO2 26  GLUCOSE 108*  BUN 29*  CREATININE 1.48*  CALCIUM 9.2    CBC: Recent Labs  Lab 09/26/17 0032  WBC 8.9  NEUTROABS 5.4  HGB 11.3*  HCT 34.5*  MCV 95.6  PLT 169    Lipid Panel: Recent Labs  Lab 09/26/17 0250  CHOL 173  TRIG 48  HDL 84  CHOLHDL 2.1  VLDL 10  LDLCALC 79    CBG: No results for input(s): GLUCAP in the last 168 hours.  Imaging: Ct Head Wo Contrast  Result Date: 09/26/2017 CLINICAL DATA:  Generalize weakness. No head trauma. History of hypertension. Previous history of stroke. EXAM: CT HEAD WITHOUT CONTRAST TECHNIQUE: Contiguous axial images were obtained from the base of the skull through the vertex without intravenous contrast. COMPARISON:  MRI brain 10/27/2015.  CT head 10/26/2015. FINDINGS: Brain: Diffuse cerebral atrophy. No ventricular dilatation. Low-attenuation changes in the deep white matter consistent with small vessel ischemia. No mass effect or midline shift. No abnormal extra-axial fluid collections. Gray-white matter junctions are distinct. Basal cisterns are not effaced. No acute intracranial hemorrhage. Vascular: Intracranial arterial vascular calcifications are present. Skull: Calvarium appears intact. Sinuses/Orbits: Paranasal sinuses and mastoid air cells are clear. Other: None. IMPRESSION: No acute intracranial abnormalities. Mild chronic atrophy and small vessel ischemic changes for age. Electronically Signed   By: Burman NievesWilliam  Stevens M.D.   On: 09/26/2017 00:16   Mr Brain Wo Contrast  Result Date: 09/26/2017 CLINICAL DATA:  84 y/o F; acute numbness and weakness involving the right lower extremity. EXAM: MRI HEAD WITHOUT CONTRAST MRA HEAD WITHOUT CONTRAST TECHNIQUE: Multiplanar, multiecho pulse sequences of the brain and surrounding structures were obtained without intravenous contrast. Angiographic images of the head were obtained using MRA technique without contrast. COMPARISON:  09/26/2017 CT head  FINDINGS: MRI HEAD FINDINGS Brain: No acute infarction, hemorrhage, hydrocephalus, extra-axial collection or mass lesion. Fewnonspecific foci of T2 FLAIR hyperintense signal abnormality in subcortical and periventricular white matter are compatible withmildchronic microvascular ischemic changes for age. Mildbrain parenchymal volume loss. Vascular: As below. Skull and upper cervical spine: Normal marrow signal. Sinuses/Orbits: No significant abnormal signal of paranasal sinuses or mastoid air cells. Bilateral intra-ocular lens replacement. Other: None. MRA HEAD FINDINGS Internal carotid arteries:  Patent. Anterior cerebral arteries: Patent. Left larger than right A1 segments and anterior communicating artery, normal variant. Middle cerebral arteries: Patent.  Mild left proximal M1 stenosis. Anterior communicating artery: Patent. Posterior communicating arteries: Patent. Fetal left PCA. Prominent infundibulum of left ICA origin. Posterior cerebral arteries: Patent. Accessory right PCA arising from posterior communicating artery. Basilar artery: Patent. Mid basilar segment of moderate to severe stenosis. Vertebral arteries:  Patent. No evidence of high-grade stenosis, large vessel occlusion, or aneurysm unless noted above. IMPRESSION: MRI head: 1. No acute intracranial abnormality identified. 2. Mild for age chronic microvascular ischemic changes and mild parenchymal volume loss of the brain. MRA head: 1. Patent anterior and posterior circulation. No large vessel occlusion or aneurysm. 2. Midbasilar short segment of moderate to severe stenosis. Electronically Signed   By: Mitzi HansenLance  Furusawa-Stratton M.D.   On: 09/26/2017 05:39   Mr Shirlee LatchMra Head WUWo Contrast  Result Date: 09/26/2017 CLINICAL DATA:  84 y/o F; acute numbness and weakness involving the right lower extremity. EXAM: MRI HEAD WITHOUT CONTRAST MRA HEAD WITHOUT CONTRAST TECHNIQUE: Multiplanar, multiecho pulse sequences of the brain and surrounding structures were  obtained without intravenous contrast. Angiographic images of the head were obtained using MRA technique without contrast. COMPARISON:  09/26/2017 CT head FINDINGS: MRI HEAD FINDINGS Brain: No acute infarction, hemorrhage, hydrocephalus, extra-axial collection or mass lesion. Fewnonspecific foci of T2 FLAIR hyperintense signal abnormality in subcortical and periventricular white matter are compatible withmildchronic microvascular ischemic changes for age. Mildbrain parenchymal volume loss. Vascular: As below. Skull and upper cervical spine: Normal marrow signal. Sinuses/Orbits: No significant abnormal signal of paranasal sinuses or mastoid air cells. Bilateral intra-ocular lens replacement. Other: None. MRA HEAD FINDINGS Internal carotid arteries:  Patent. Anterior cerebral arteries: Patent. Left larger than right A1 segments and anterior communicating artery, normal variant.  Middle cerebral arteries: Patent.  Mild left proximal M1 stenosis. Anterior communicating artery: Patent. Posterior communicating arteries: Patent. Fetal left PCA. Prominent infundibulum of left ICA origin. Posterior cerebral arteries: Patent. Accessory right PCA arising from posterior communicating artery. Basilar artery: Patent. Mid basilar segment of moderate to severe stenosis. Vertebral arteries:  Patent. No evidence of high-grade stenosis, large vessel occlusion, or aneurysm unless noted above. IMPRESSION: MRI head: 1. No acute intracranial abnormality identified. 2. Mild for age chronic microvascular ischemic changes and mild parenchymal volume loss of the brain. MRA head: 1. Patent anterior and posterior circulation. No large vessel occlusion or aneurysm. 2. Midbasilar short segment of moderate to severe stenosis. Electronically Signed   By: Mitzi Hansen M.D.   On: 09/26/2017 05:39   Dg Hip Unilat With Pelvis 2-3 Views Right  Result Date: 09/26/2017 CLINICAL DATA:  Right hip pain EXAM: DG HIP (WITH OR WITHOUT PELVIS)  2-3V RIGHT COMPARISON:  None. FINDINGS: Generalized osteopenia. No right hip fracture or dislocation. Left hip arthroplasty without failure or complication. Lower lumbar spine spondylosis. There is peripheral vascular atherosclerotic disease. IMPRESSION: 1. No acute osseous injury of the right hip. Given the patient's age and osteopenia, if there is persistent clinical concern for an occult hip fracture, a MRI of the hip is recommended for increased sensitivity. Electronically Signed   By: Elige Ko   On: 09/26/2017 09:54    Felicie Morn PA-C [facility] [phone]  09/26/2017, 10:22 AM    NEUROHOSPITALIST ADDENDUM Seen and examined the patient today.   ASSESSMENT AND PLAN  84 y.o. female history of hypertension, chronic kidney disease, B12 deficiency, stroke in the past.  Patient at baseline walks with a walker, who presents with  sudden onset of numbness in the right leg weakness and numbness. She is unable to stand up and walk. Admitted for stroke workup, however MRI Brain negative for acute stroke.   Right leg weakness Patient has more weakness to plantar knee and hip flexors. Denies radicular pain. Reflexes diminished bilaterally with bilateral wasting of muscles makes it difficult to localize weakness, but radiculopathy/spinal cord pathology suspected. No significant tenderness at the right hip joint and she is able to bear weight on he right leg without pain.   Recommendations Consider MRI L spine, however I suspect she would not a be a surgical candidate if compressive lesion found PT/OT     Georgiana Spinner Aroor MD [facility] [phone]  If 7pm to 7am, please call on call as listed on AMION.

## 2017-09-26 NOTE — Evaluation (Addendum)
Physical Therapy Evaluation Patient Details Name: Jennifer Pittman MRN: [id number] DOB: 10/06/1917 Today's Date: 09/26/2017   History of Present Illness  Pt is a 84 y/o female admitted secondary to RLE weakness. MRI and CT negative for acute abnormality. Hip imaging negative for acute abnormality as well. PMH includes CVA, HTN, L THA, CKD IV.   Clinical Impression  Pt admitted secondary to problem above with deficits below. PTA, pt was mod I PTA with use of RW. However, upon eval, pt requiring min to max A for basic mobility tasks secondary to RLE weakness and pain. Pt's family reporting they are unable to give pt physical assist needed at d/c, therefore, pt will need further post acute rehab at d/c. Will continue to follow acutely to maximize functional mobility independence and safety.     Follow Up Recommendations SNF;Supervision/Assistance - 24 hour    Equipment Recommendations  None recommended by PT    Recommendations for Other Services       Precautions / Restrictions Precautions Precautions: Fall Restrictions Weight Bearing Restrictions: No      Mobility  Bed Mobility Overal bed mobility: Needs Assistance Bed Mobility: Supine to Sit;Sit to Supine;Rolling Rolling: Supervision   Supine to sit: Max assist Sit to supine: Mod assist   General bed mobility comments: Max A for scooting hips to EOB and for trunk elevation. Required use of bed rails and elevated HOB. Pt requiring mod A for LE lift assist for return to supine. Required supervision for rolling side to side for clean up after episode of incontinence.   Transfers Overall transfer level: Needs assistance Equipment used: Rolling walker (2 wheeled) Transfers: Sit to/from Stand Sit to Stand: Max assist;Mod assist         General transfer comment: Sit<>stand X 2; required max A for lift assist on initial attempt and mod A for lift assist during second attempt. Increased time and effort required. Cues for safe  hand placement. R knee unable to come to full extension in standing.   Ambulation/Gait Ambulation/Gait assistance: Min assist       Gait velocity: Decreased  Gait velocity interpretation: Below normal speed for age/gender General Gait Details: Able to take a few side steps along EOB, however, unsteady secondary to RLE weakness. Pt reports requiring increased assist than usual.   Stairs            Wheelchair Mobility    Modified Rankin (Stroke Patients Only)       Balance Overall balance assessment: Needs assistance Sitting-balance support: No upper extremity supported;Feet supported Sitting balance-Leahy Scale: Fair     Standing balance support: Bilateral upper extremity supported;During functional activity Standing balance-Leahy Scale: Poor Standing balance comment: Reliant on BUE support and external support to stand.                              Pertinent Vitals/Pain Pain Assessment: Faces Faces Pain Scale: Hurts little more Pain Location: RLE  Pain Descriptors / Indicators: Aching;Grimacing Pain Intervention(s): Limited activity within patient's tolerance;Monitored during session;Repositioned    Home Living Family/patient expects to be discharged to:: Private residence Living Arrangements: Children Available Help at Discharge: Family;Available 24 hours/day Type of Home: House Home Access: Stairs to enter Entrance Stairs-Rails: Left Entrance Stairs-Number of Steps: 3 Home Layout: One level Home Equipment: Walker - 2 wheels;Shower seat;Grab bars - tub/shower;Hand held shower head Additional Comments: Son and his wife not able to physically assist.  Prior Function Level of Independence: Independent with assistive device(s)         Comments: Uses RW at home      Hand Dominance   Dominant Hand: Right    Extremity/Trunk Assessment   Upper Extremity Assessment Upper Extremity Assessment: Defer to OT evaluation    Lower Extremity  Assessment Lower Extremity Assessment: RLE deficits/detail RLE Deficits / Details: Decreased strength noted in RLE; grossly 3+/5 throughout. Unable to achieve full knee extension in standing. Reports sensation is better     Cervical / Trunk Assessment Cervical / Trunk Assessment: Kyphotic  Communication   Communication: No difficulties  Cognition Arousal/Alertness: Awake/alert Behavior During Therapy: WFL for tasks assessed/performed Overall Cognitive Status: Within Functional Limits for tasks assessed                                        General Comments General comments (skin integrity, edema, etc.): Discussed with pt and pt's family about current level of assist and deficits. Pt's family concerned about taking pt home, as pt requiring more assist than normal. Educated about SNF recommendations and pt and family agreeable.     Exercises     Assessment/Plan    PT Assessment Patient needs continued PT services  PT Problem List Decreased strength;Decreased balance;Decreased mobility;Decreased coordination;Decreased knowledge of use of DME;Decreased knowledge of precautions;Pain       PT Treatment Interventions DME instruction;Gait training;Functional mobility training;Therapeutic activities;Patient/family education;Stair training;Balance training;Neuromuscular re-education;Therapeutic exercise    PT Goals (Current goals can be found in the Care Plan section)  Acute Rehab PT Goals Patient Stated Goal: to get stronger  PT Goal Formulation: With patient/family Time For Goal Achievement: 10/10/17 Potential to Achieve Goals: Good    Frequency Min 2X/week   Barriers to discharge        Co-evaluation               AM-PAC PT "6 Clicks" Daily Activity  Outcome Measure Difficulty turning over in bed (including adjusting bedclothes, sheets and blankets)?: A Little Difficulty moving from lying on back to sitting on the side of the bed? : Unable Difficulty  sitting down on and standing up from a chair with arms (e.g., wheelchair, bedside commode, etc,.)?: Unable Help needed moving to and from a bed to chair (including a wheelchair)?: A Little Help needed walking in hospital room?: A Lot Help needed climbing 3-5 steps with a railing? : A Lot 6 Click Score: 12    End of Session Equipment Utilized During Treatment: Gait belt Activity Tolerance: Patient tolerated treatment well Patient left: in bed;with call bell/phone within reach;with nursing/sitter in room;with family/visitor present Nurse Communication: Mobility status PT Visit Diagnosis: Unsteadiness on feet (R26.81);Muscle weakness (generalized) (M62.81);Pain Pain - Right/Left: Right Pain - part of body: Leg    Time: 1610-9604 PT Time Calculation (min) (ACUTE ONLY): 48 min   Charges:   PT Evaluation $PT Eval Low Complexity: 1 Low PT Treatments $Therapeutic Activity: 23-37 mins   PT G Codes:        Jennifer Pittman, PT, DPT  Acute Rehabilitation Services  Pager: [phone]   Jennifer Pittman 09/26/2017, 4:29 PM

## 2017-09-26 NOTE — ED Provider Notes (Signed)
[facility] EMERGENCY DEPARTMENT Provider Note   CSN: [id number] Arrival date & time: 09/25/17  2232     History   Chief Complaint Chief Complaint  Patient presents with  . Weakness    HPI Jennifer Pittman is a 84 y.o. female.  The history is provided by the patient and a relative.  Weakness  Primary symptoms include focal weakness. This is a new problem. The current episode started 6 to 12 hours ago. The problem has been gradually improving. There was right lower extremity focality noted. There has been no fever. Pertinent negatives include no shortness of breath, no chest pain, no vomiting, no altered mental status, no confusion and no headaches. Associated medical issues include CVA.  Patient with a history of previous stroke, hypertension presents with weakness. At approximately 1330 on January 30, patient was walking when she felt her right leg became numb and weak she was unable to continue walking. Soon after she also felt weak in her arms as well.  Son who is at bedside reports her left leg at times looked weak as well She had no pain in her legs, no back pain, no neck pain.  No chest pain, no abdominal pain. She now reports persistent pain and numbness in her right leg but no other acute complaints.  No recent trauma, no new meds  Past Medical History:  Diagnosis Date  . B12 deficiency 10/26/2015  . Bundle branch block, left   . CKD (chronic kidney disease), stage IV (HCC) 10/26/2015  . Hypertension   . Hypothyroidism 10/26/2015  . Osteoarthritis   . Pernicious anemia   . Stroke (HCC)   . Thyroid disease     Patient Active Problem List   Diagnosis Date Noted  . Acute CVA (cerebrovascular accident) (HCC) 10/27/2015  . Stroke (cerebrum) (HCC) 10/26/2015  . Facial droop 10/26/2015  . HTN (hypertension) 10/26/2015  . BBB (bundle branch block) 10/26/2015  . Pernicious anemia 10/26/2015  . Osteoarthritis 10/26/2015  . E. coli UTI: 10/19/2015 at PCP  office 10/26/2015  . B12 deficiency 10/26/2015  . Hypothyroidism 10/26/2015  . Hyperlipidemia 10/26/2015  . CKD (chronic kidney disease), stage IV (HCC) 10/26/2015  . Elevated troponin   . Slurred speech     Past Surgical History:  Procedure Laterality Date  . ABDOMINAL HYSTERECTOMY    . hyterectomy    . TOTAL HIP ARTHROPLASTY Left     OB History    No data available       Home Medications    Prior to Admission medications   Medication Sig Start Date End Date Taking? Authorizing Provider  amLODipine (NORVASC) 10 MG tablet Take 10 mg by mouth daily.    [provider]  atorvastatin (LIPITOR) 10 MG tablet Take 1 tablet (10 mg total) by mouth daily at 6 PM. 10/29/15   [name], MD  Cholecalciferol (VITAMIN D) 2000 units tablet Take 2,000 Units by mouth daily.    [provider]  clopidogrel (PLAVIX) 75 MG tablet Take 1 tablet (75 mg total) by mouth daily. 03/06/16   Nilda Riggs, NP  Cyanocobalamin (B-12 IJ) Inject 1,000 mcg as directed every 30 (thirty) days.    [provider]  dorzolamide-timolol (COSOPT) 22.3-6.8 MG/ML ophthalmic solution Place 1 drop into both eyes 2 (two) times daily. 09/06/15   [provider]  hydrochlorothiazide (HYDRODIURIL) 50 MG tablet Take 50 mg by mouth daily.    [provider]  latanoprost (XALATAN) 0.005 % ophthalmic solution  Place 1 drop into both eyes at bedtime. 09/07/15   [provider]  levothyroxine (SYNTHROID, LEVOTHROID) 112 MCG tablet Take 112 mcg by mouth daily before breakfast. 09/06/15   [provider]  Multiple Vitamin (MULTIVITAMIN WITH MINERALS) TABS tablet Take 1 tablet by mouth daily.    [provider]  telmisartan (MICARDIS) 20 MG tablet Take 20 mg by mouth daily.    [provider]    Family History No family history on file.  Social History Social History   Tobacco Use  . Smoking status: Never Smoker  . Smokeless tobacco: Never  Used  Substance Use Topics  . Alcohol use: No  . Drug use: No     Allergies   Patient has no known allergies.   Review of Systems Review of Systems  Constitutional: Negative for fever.  Respiratory: Negative for shortness of breath.   Cardiovascular: Negative for chest pain.  Gastrointestinal: Negative for abdominal pain and vomiting.  Musculoskeletal: Negative for back pain and neck pain.  Neurological: Positive for focal weakness, weakness and numbness. Negative for syncope, speech difficulty and headaches.  Psychiatric/Behavioral: Negative for confusion.  All other systems reviewed and are negative.    Physical Exam Updated Vital Signs BP (!) 179/88 (BP Location: Left Arm)   Pulse (!) 57   Temp 97.7 F (36.5 C) (Oral)   Resp 16   SpO2 100%   Physical Exam CONSTITUTIONAL: Elderly no acute distress HEAD: Normocephalic/atraumatic EYES: EOMI/PERRL, no nystagmus,  no ptosis ENMT: Mucous membranes moist NECK: supple no meningeal signs CV: S1/S2 noted, no murmurs/rubs/gallops noted LUNGS: Lungs are clear to auscultation bilaterally, no apparent distress ABDOMEN: soft, minimal left lower quadrant tenderness, no pulsatile mass noted, no bruit noted, no rebound or guarding GU:no cva tenderness NEURO:Awake/alert, face symmetric, no arm drift is noted Equal 5/5 strength with shoulder abduction Right lower extremity drift is noted, no weakness noted to left lower extremity Cranial nerves 3/4/5/6/03/04/09/11/12 tested and intact EXTREMITIES:  full ROM, bilateral femoral pulses noted, feet are cool to touch but no discoloration patient reports this is chronic SKIN: warm, color normal PSYCH: no abnormalities of mood noted   ED Treatments / Results  Labs (all labs ordered are listed, but only abnormal results are displayed) Labs Reviewed  APTT - Abnormal; Notable for the following components:      Result Value   aPTT 41 (*)    All other components within normal limits  CBC  - Abnormal; Notable for the following components:   RBC 3.61 (*)    Hemoglobin 11.3 (*)    HCT 34.5 (*)    All other components within normal limits  COMPREHENSIVE METABOLIC PANEL - Abnormal; Notable for the following components:   Glucose, Bld 108 (*)    BUN 29 (*)    Creatinine, Ser 1.48 (*)    Total Protein 5.6 (*)    Albumin 3.3 (*)    ALT 10 (*)    GFR calc non Af Amer 28 (*)    GFR calc Af Amer 33 (*)    All other components within normal limits  I-STAT TROPONIN, ED - Abnormal; Notable for the following components:   Troponin i, poc 0.16 (*)    All other components within normal limits  PROTIME-INR  DIFFERENTIAL  ETHANOL  RAPID URINE DRUG SCREEN, HOSP PERFORMED  URINALYSIS, ROUTINE W REFLEX MICROSCOPIC    EKG  EKG Interpretation  Date/Time:  Thursday September 26 2017 00:40:17 EST Ventricular Rate:  64 PR Interval:  QRS Duration: 142 QT Interval:  435 QTC Calculation: 449 R Axis:   -20 Text Interpretation:  Sinus rhythm Left bundle branch block No significant change since last tracing Confirmed by Zadie Rhine ([id number]) on 09/26/2017 12:51:23 AM       Radiology Ct Head Wo Contrast  Result Date: 09/26/2017 CLINICAL DATA:  Generalize weakness. No head trauma. History of hypertension. Previous history of stroke. EXAM: CT HEAD WITHOUT CONTRAST TECHNIQUE: Contiguous axial images were obtained from the base of the skull through the vertex without intravenous contrast. COMPARISON:  MRI brain 10/27/2015.  CT head 10/26/2015. FINDINGS: Brain: Diffuse cerebral atrophy. No ventricular dilatation. Low-attenuation changes in the deep white matter consistent with small vessel ischemia. No mass effect or midline shift. No abnormal extra-axial fluid collections. Gray-white matter junctions are distinct. Basal cisterns are not effaced. No acute intracranial hemorrhage. Vascular: Intracranial arterial vascular calcifications are present. Skull: Calvarium appears intact.  Sinuses/Orbits: Paranasal sinuses and mastoid air cells are clear. Other: None. IMPRESSION: No acute intracranial abnormalities. Mild chronic atrophy and small vessel ischemic changes for age. Electronically Signed   By: Burman Nieves M.D.   On: 09/26/2017 00:16    Procedures Procedures   Medications Ordered in ED Medications - No data to display   Initial Impression / Assessment and Plan / ED Course  I have reviewed the triage vital signs and the nursing notes.  Pertinent labs & imaging results that were available during my care of the patient were reviewed by me and considered in my medical decision making (see chart for details).     12:29 AM Patient with difficult history, reports initially having weakness and numbness in her right leg, and reports at one point she had weakness in her arms but that is improved.  Currently she is only weak in her right leg No significant back or abdominal pain at this time At this point with report of weakness in her arms and now in her legs, stroke is a concern, workup initiated tPA in stroke considered but not given due to: Onset over 3-4.5hours 12:51 AM Pulses noted in feet with doppler per nursing 2:39 AM Patient stable, plan to admit.  Discussed with Dr. Antionette Char for admission Final Clinical Impressions(s) / ED Diagnoses   Final diagnoses:  Weakness    ED Discharge Orders    None       Zadie Rhine, MD 09/26/17 [phone]

## 2017-09-26 NOTE — Progress Notes (Addendum)
PROGRESS NOTE   Jennifer Pittman  ZOX:[id number]    DOB: 21-Mar-1918    DOA: 09/25/2017  PCP: Georgianne Fick, MD   I have briefly reviewed patients previous medical records in [facility].  Brief Narrative:  84 year old female patient, lives with her son and daughter-in-law, ambulates with the help of a walker, PMH of hypothyroid, HTN, CVA without residual deficits, B12 deficiency, stage IV chronic kidney disease, LBBB presented to ED with acute onset of right lower extremity weakness and numbness while she was ambulating at home with her walker at around 1:30 PM on 09/25/17.  She also reported associated bilateral upper extremity weakness.  CT head and MRI brain negative for stroke.  Neurology consulted.   Assessment & Plan:   Principal Problem:   Weakness of right leg Active Problems:   History of CVA (cerebrovascular accident)   HTN (hypertension)   Hypothyroidism   Elevated troponin   Right leg weakness   1. Right leg weakness and numbness: Unclear etiology.  Also reported transient upper extremity weakness which has since resolved.  CT head and MRI brain negative for acute stroke.  MRA brain without large vessel occlusion.?  TIA?  Related to arthritic pain rather than true neurologic etiology.  2D echo: LVEF 35-40% and grade 1 diastolic dysfunction.  Carotid Dopplers: 1-39% ICA stenosis and vertebral artery flow is antegrade.  A1c: 5.9.  LDL 79.  Patient on Plavix PTA, continue same.  PT, OT and ST evaluation.  Neurology consulted.  X-ray of right hip without fractures.  Pain management. 2. Right hip pain: Possibly osteoarthritic.  No fracture on x-ray. No h/o fall. Pain Mx and PT eval. 3. Essential hypertension: Holding HCTZ, Norvasc and telmisartan pending TIA workup.  Allowing for permissive hypertension. 4. Hypothyroid: Continue Synthroid. 5. Stage IV chronic kidney disease: Creatinine at baseline. 6. Elevated troponin: No report of chest pain.  EKG with chronic LBBB.?   Chronically elevated.  Continue Plavix and statins. 7. Cardiomyopathy: Chronic LBBB on EKG.  LVEF has decreased compared to echo in 2017.  Unclear etiology.  May not be a candidate for aggressive intervention given advanced age.  Continue ARB, statins, Plavix.  Consider outpatient cardiology consultation and follow-up. 8. Anemia: May be related to chronic kidney disease.  Stable compared to prior. 9. Asymptomatic bacteriuria: Patient specifically denies urinary frequency, dysuria, fever or chills.  Monitor off of antibiotics.   DVT prophylaxis: Heparin Code Status: DNR Family Communication: None at bedside Disposition: DC home pending clinical improvement and therapy evaluation.   Consultants:  Neurology  Procedures:  None  Antimicrobials:  None   Subjective: Seen this morning while in ED.  Denies upper extremity weakness or numbness.  States that right lower extremity feels better with decrease in numbness but still some weakness and pain in the right hip with movement.  Denies fall.  No chest pain, dyspnea, dizziness, lightheadedness or palpitations.  Denies dysuria, urinary frequency, fever or chills.  ROS: As above.  Objective:  Vitals:   09/26/17 0845 09/26/17 0930 09/26/17 1253 09/26/17 1415  BP: (!) 154/73 (!) 166/92 (!) 174/87 (!) 170/74  Pulse: 60 61 66 (!) 59  Resp: 16 14 12 13   Temp:      TempSrc:      SpO2: 99% 95% 99% 97%    Examination:  General exam: Pleasant elderly female, small built and thinly nourished, lying comfortably propped up in bed. Respiratory system: Clear to auscultation. Respiratory effort normal. Cardiovascular system: S1 & S2 heard, RRR.  No JVD, murmurs, rubs, gallops or clicks. No pedal edema.  Telemetry: SB in the 50s-SR in the 60s. Gastrointestinal system: Abdomen is nondistended, soft and nontender. No organomegaly or masses felt. Normal bowel sounds heard. Central nervous system: Alert and oriented. No focal neurological  deficits. Extremities: Symmetric 5 x 5 power except right lower extremity where power assessment limited secondary to pain in the right proximal thigh/hip.  Mild tenderness around the right hip but no erythema, bruising or increased warmth.. Skin: No rashes, lesions or ulcers Psychiatry: Judgement and insight appear normal. Mood & affect appropriate.     Data Reviewed: I have personally reviewed following labs and imaging studies  CBC: Recent Labs  Lab 09/26/17 0032  WBC 8.9  NEUTROABS 5.4  HGB 11.3*  HCT 34.5*  MCV 95.6  PLT 169   Basic Metabolic Panel: Recent Labs  Lab 09/26/17 0032  NA 138  K 4.0  CL 102  CO2 26  GLUCOSE 108*  BUN 29*  CREATININE 1.48*  CALCIUM 9.2   Liver Function Tests: Recent Labs  Lab 09/26/17 0032  AST 16  ALT 10*  ALKPHOS 55  BILITOT 0.9  PROT 5.6*  ALBUMIN 3.3*   Coagulation Profile: Recent Labs  Lab 09/26/17 0032  INR 1.05   Cardiac Enzymes: No results for input(s): CKTOTAL, CKMB, CKMBINDEX, TROPONINI in the last 168 hours. HbA1C: Recent Labs    09/26/17 0250  HGBA1C 5.9*   CBG: No results for input(s): GLUCAP in the last 168 hours.  No results found for this or any previous visit (from the past 240 hour(s)).       Radiology Studies: Ct Head Wo Contrast  Result Date: 09/26/2017 CLINICAL DATA:  Generalize weakness. No head trauma. History of hypertension. Previous history of stroke. EXAM: CT HEAD WITHOUT CONTRAST TECHNIQUE: Contiguous axial images were obtained from the base of the skull through the vertex without intravenous contrast. COMPARISON:  MRI brain 10/27/2015.  CT head 10/26/2015. FINDINGS: Brain: Diffuse cerebral atrophy. No ventricular dilatation. Low-attenuation changes in the deep white matter consistent with small vessel ischemia. No mass effect or midline shift. No abnormal extra-axial fluid collections. Gray-white matter junctions are distinct. Basal cisterns are not effaced. No acute intracranial  hemorrhage. Vascular: Intracranial arterial vascular calcifications are present. Skull: Calvarium appears intact. Sinuses/Orbits: Paranasal sinuses and mastoid air cells are clear. Other: None. IMPRESSION: No acute intracranial abnormalities. Mild chronic atrophy and small vessel ischemic changes for age. Electronically Signed   By: Burman Nieves M.D.   On: 09/26/2017 00:16   Mr Brain Wo Contrast  Result Date: 09/26/2017 CLINICAL DATA:  84 y/o F; acute numbness and weakness involving the right lower extremity. EXAM: MRI HEAD WITHOUT CONTRAST MRA HEAD WITHOUT CONTRAST TECHNIQUE: Multiplanar, multiecho pulse sequences of the brain and surrounding structures were obtained without intravenous contrast. Angiographic images of the head were obtained using MRA technique without contrast. COMPARISON:  09/26/2017 CT head FINDINGS: MRI HEAD FINDINGS Brain: No acute infarction, hemorrhage, hydrocephalus, extra-axial collection or mass lesion. Fewnonspecific foci of T2 FLAIR hyperintense signal abnormality in subcortical and periventricular white matter are compatible withmildchronic microvascular ischemic changes for age. Mildbrain parenchymal volume loss. Vascular: As below. Skull and upper cervical spine: Normal marrow signal. Sinuses/Orbits: No significant abnormal signal of paranasal sinuses or mastoid air cells. Bilateral intra-ocular lens replacement. Other: None. MRA HEAD FINDINGS Internal carotid arteries:  Patent. Anterior cerebral arteries: Patent. Left larger than right A1 segments and anterior communicating artery, normal variant. Middle cerebral arteries: Patent.  Mild  left proximal M1 stenosis. Anterior communicating artery: Patent. Posterior communicating arteries: Patent. Fetal left PCA. Prominent infundibulum of left ICA origin. Posterior cerebral arteries: Patent. Accessory right PCA arising from posterior communicating artery. Basilar artery: Patent. Mid basilar segment of moderate to severe stenosis.  Vertebral arteries:  Patent. No evidence of high-grade stenosis, large vessel occlusion, or aneurysm unless noted above. IMPRESSION: MRI head: 1. No acute intracranial abnormality identified. 2. Mild for age chronic microvascular ischemic changes and mild parenchymal volume loss of the brain. MRA head: 1. Patent anterior and posterior circulation. No large vessel occlusion or aneurysm. 2. Midbasilar short segment of moderate to severe stenosis. Electronically Signed   By: Mitzi HansenLance  Furusawa-Stratton M.D.   On: 09/26/2017 05:39   Mr Shirlee LatchMra Head ZOWo Contrast  Result Date: 09/26/2017 CLINICAL DATA:  84 y/o F; acute numbness and weakness involving the right lower extremity. EXAM: MRI HEAD WITHOUT CONTRAST MRA HEAD WITHOUT CONTRAST TECHNIQUE: Multiplanar, multiecho pulse sequences of the brain and surrounding structures were obtained without intravenous contrast. Angiographic images of the head were obtained using MRA technique without contrast. COMPARISON:  09/26/2017 CT head FINDINGS: MRI HEAD FINDINGS Brain: No acute infarction, hemorrhage, hydrocephalus, extra-axial collection or mass lesion. Fewnonspecific foci of T2 FLAIR hyperintense signal abnormality in subcortical and periventricular white matter are compatible withmildchronic microvascular ischemic changes for age. Mildbrain parenchymal volume loss. Vascular: As below. Skull and upper cervical spine: Normal marrow signal. Sinuses/Orbits: No significant abnormal signal of paranasal sinuses or mastoid air cells. Bilateral intra-ocular lens replacement. Other: None. MRA HEAD FINDINGS Internal carotid arteries:  Patent. Anterior cerebral arteries: Patent. Left larger than right A1 segments and anterior communicating artery, normal variant. Middle cerebral arteries: Patent.  Mild left proximal M1 stenosis. Anterior communicating artery: Patent. Posterior communicating arteries: Patent. Fetal left PCA. Prominent infundibulum of left ICA origin. Posterior cerebral  arteries: Patent. Accessory right PCA arising from posterior communicating artery. Basilar artery: Patent. Mid basilar segment of moderate to severe stenosis. Vertebral arteries:  Patent. No evidence of high-grade stenosis, large vessel occlusion, or aneurysm unless noted above. IMPRESSION: MRI head: 1. No acute intracranial abnormality identified. 2. Mild for age chronic microvascular ischemic changes and mild parenchymal volume loss of the brain. MRA head: 1. Patent anterior and posterior circulation. No large vessel occlusion or aneurysm. 2. Midbasilar short segment of moderate to severe stenosis. Electronically Signed   By: Mitzi HansenLance  Furusawa-Stratton M.D.   On: 09/26/2017 05:39   Dg Hip Unilat With Pelvis 2-3 Views Right  Result Date: 09/26/2017 CLINICAL DATA:  Right hip pain EXAM: DG HIP (WITH OR WITHOUT PELVIS) 2-3V RIGHT COMPARISON:  None. FINDINGS: Generalized osteopenia. No right hip fracture or dislocation. Left hip arthroplasty without failure or complication. Lower lumbar spine spondylosis. There is peripheral vascular atherosclerotic disease. IMPRESSION: 1. No acute osseous injury of the right hip. Given the patient's age and osteopenia, if there is persistent clinical concern for an occult hip fracture, a MRI of the hip is recommended for increased sensitivity. Electronically Signed   By: Elige KoHetal  Patel   On: 09/26/2017 09:54        Scheduled Meds: .  stroke: mapping our early stages of recovery book   Does not apply Once  . atorvastatin  10 mg Oral q1800  . cholecalciferol  2,000 Units Oral Daily  . clopidogrel  75 mg Oral Daily  . dorzolamide-timolol  1 drop Both Eyes BID  . heparin  5,000 Units Subcutaneous Q8H  . latanoprost  1 drop Both Eyes QHS  .  levothyroxine  112 mcg Oral QAC breakfast  . multivitamin with minerals  1 tablet Oral Daily   Continuous Infusions:   LOS: 0 days     Marcellus Scott, MD, FACP, [facility]. [facility] Pager [phone]  If 7PM-7AM, please  contact night-coverage [email or web address] Password [email or web address] 09/26/2017, 3:35 PM

## 2017-09-27 MED ORDER — IRBESARTAN 150 MG PO TABS
75.0000 mg | ORAL_TABLET | Freq: Every day | ORAL | Status: DC
  Administered 2017-09-27: 75 mg via ORAL
  Filled 2017-09-27: qty 1

## 2017-09-27 MED ORDER — HYDROCHLOROTHIAZIDE 25 MG PO TABS
50.0000 mg | ORAL_TABLET | Freq: Every day | ORAL | Status: DC
  Administered 2017-09-27: 50 mg via ORAL
  Filled 2017-09-27: qty 2

## 2017-09-27 NOTE — Progress Notes (Signed)
CSW acknowledging consult for SNF and PT recommendations for SNF; however, patient will not meet qualifying stay criteria for Medicare to cover SNF at this time. Per RNCM, patient will go home with home health instead.   CSW signing off.  Blenda NicelyElizabeth Aidenjames Heckmann, [place]CS[place] Clinical Social Worker [phone]

## 2017-09-27 NOTE — Discharge Summary (Signed)
Physician Discharge Summary  Jennifer Pittman UJW:[id number] DOB: Jan 25, 1918  PCP: Georgianne Fick, MD  Admit date: 09/25/2017 Discharge date: 09/27/2017  Recommendations for Outpatient Follow-up:  1. Dr. Georgianne Fick, PCP in 1 week with repeat labs (CBC & BMP).  Home Health: PT, OT, RN and social work. Equipment/Devices: Rolling walker.  Discharge Condition: Improved and stable. CODE STATUS: DNR Diet recommendation: Heart healthy diet.  Discharge Diagnoses:  Principal Problem:   Weakness of right leg Active Problems:   History of CVA (cerebrovascular accident)   HTN (hypertension)   Hypothyroidism   Elevated troponin   Right leg weakness   Brief Summary: 84 year old female patient, lives with her son and daughter-in-law, ambulates with the help of a walker, PMH of hypothyroid, HTN, CVA without residual deficits, B12 deficiency, stage IV chronic kidney disease, LBBB presented to ED with acute onset of right lower extremity weakness and numbness while she was ambulating at home with her walker at around 1:30 PM on 09/25/17.  She also reported associated bilateral upper extremity weakness.  CT head and MRI brain negative for stroke.  Neurology consulted.   Assessment & Plan:   1. Right leg weakness and numbness: Unclear etiology.  Also reported transient upper extremity weakness which has since resolved.  CT head and MRI brain negative for acute stroke.  MRA brain without large vessel occlusion. 2D echo: LVEF 35-40% and grade 1 diastolic dysfunction.  Carotid Dopplers: 1-39% ICA stenosis and vertebral artery flow is antegrade.  A1c: 5.9.  LDL 79.  Patient on Plavix PTA, continue same.  PT, OT and ST evaluated.  X-ray of right hip without fractures.  Neurology consulted.  I discussed with Neuro Hospitalist 1/31.  No stroke and most likely not TIA.  She could have had radiculopathy/spinal cord pathology.  However given advanced age and frail status, she would not be a surgical  candidate even if lesion was found.  This was discussed in detail with patient's son by Neurology and me and hence no further workup i.e. MRI of L-spine was pursued.  Patient's symptoms have spontaneously resolved.  Therapies recommended SNF.  However patient does not meet qualifying stay criteria for Medicare to cover SNF at this time hence patient will go home with home health therapies. 2. Right hip pain: Possibly osteoarthritic.  No fracture on x-ray. No h/o fall. Pain Mx and PT eval. pain seems to have resolved. 3. Essential hypertension:  Continue prior home antihypertensives. 4. Hypothyroid: Continue Synthroid. 5. Stage IV chronic kidney disease: Creatinine at baseline. 6. Elevated troponin: No report of chest pain.  EKG with chronic LBBB.?  Chronically elevated.  Continue Plavix and statins. 7. Cardiomyopathy: Chronic LBBB on EKG.  LVEF has decreased compared to echo in 2017.  Unclear etiology.  May not be a candidate for aggressive intervention given advanced age.  Continue ARB, statins, Plavix.  Consider outpatient cardiology consultation and follow-up.  This was discussed in detail with patient's son who agrees with current conservative treatment and no further interventions. 8. Anemia: May be related to chronic kidney disease.  Stable compared to prior.  Outpatient follow-up. 9. Asymptomatic bacteriuria: Patient specifically denies urinary frequency, dysuria, fever or chills.  Monitored off of antibiotics.   Consultants:  Neurology  Procedures:  As above.    Discharge Instructions  Discharge Instructions    Call MD for:   Complete by:  As directed    Recurrent leg or arm weakness or numbness or strokelike symptoms.   Diet - low sodium heart healthy  Complete by:  As directed    Increase activity slowly   Complete by:  As directed        Medication List    TAKE these medications   amLODipine 10 MG tablet Commonly known as:  NORVASC Take 10 mg by mouth daily.    atorvastatin 10 MG tablet Commonly known as:  LIPITOR Take 1 tablet (10 mg total) by mouth daily at 6 PM.   B-12 IJ Inject 1,000 mcg as directed every 30 (thirty) days.   clopidogrel 75 MG tablet Commonly known as:  PLAVIX Take 1 tablet (75 mg total) by mouth daily.   dorzolamide-timolol 22.3-6.8 MG/ML ophthalmic solution Commonly known as:  COSOPT Place 1 drop into both eyes 2 (two) times daily.   hydrochlorothiazide 50 MG tablet Commonly known as:  HYDRODIURIL Take 50 mg by mouth daily.   latanoprost 0.005 % ophthalmic solution Commonly known as:  XALATAN Place 1 drop into both eyes at bedtime.   levothyroxine 112 MCG tablet Commonly known as:  SYNTHROID, LEVOTHROID Take 112 mcg by mouth daily before breakfast.   multivitamin with minerals Tabs tablet Take 1 tablet by mouth daily.   telmisartan 20 MG tablet Commonly known as:  MICARDIS Take 20 mg by mouth daily.   Vitamin D 2000 units tablet Take 2,000 Units by mouth daily.      Follow-up Information    Georgianne Fick, MD. Schedule an appointment as soon as possible for a visit in 1 week(s).   Specialty:  Internal Medicine Why:  To be seen with repeat labs (CBC & BMP). Contact information: [address] [place] [address] [phone]        Care, [facility] Follow up.   Specialty:  Home Health Services Why:  They will contact you for the first visit. Contact information: [address] [place] [address] [phone]          No Known Allergies    Procedures/Studies: Ct Head Wo Contrast  Result Date: 09/26/2017 CLINICAL DATA:  Generalize weakness. No head trauma. History of hypertension. Previous history of stroke. EXAM: CT HEAD WITHOUT CONTRAST TECHNIQUE: Contiguous axial images were obtained from the base of the skull through the vertex without intravenous contrast. COMPARISON:  MRI brain 10/27/2015.  CT head 10/26/2015. FINDINGS: Brain: Diffuse  cerebral atrophy. No ventricular dilatation. Low-attenuation changes in the deep white matter consistent with small vessel ischemia. No mass effect or midline shift. No abnormal extra-axial fluid collections. Gray-white matter junctions are distinct. Basal cisterns are not effaced. No acute intracranial hemorrhage. Vascular: Intracranial arterial vascular calcifications are present. Skull: Calvarium appears intact. Sinuses/Orbits: Paranasal sinuses and mastoid air cells are clear. Other: None. IMPRESSION: No acute intracranial abnormalities. Mild chronic atrophy and small vessel ischemic changes for age. Electronically Signed   By: Burman Nieves M.D.   On: 09/26/2017 00:16   Mr Brain Wo Contrast  Result Date: 09/26/2017 CLINICAL DATA:  84 y/o F; acute numbness and weakness involving the right lower extremity. EXAM: MRI HEAD WITHOUT CONTRAST MRA HEAD WITHOUT CONTRAST TECHNIQUE: Multiplanar, multiecho pulse sequences of the brain and surrounding structures were obtained without intravenous contrast. Angiographic images of the head were obtained using MRA technique without contrast. COMPARISON:  09/26/2017 CT head FINDINGS: MRI HEAD FINDINGS Brain: No acute infarction, hemorrhage, hydrocephalus, extra-axial collection or mass lesion. Fewnonspecific foci of T2 FLAIR hyperintense signal abnormality in subcortical and periventricular white matter are compatible withmildchronic microvascular ischemic changes for age. Mildbrain parenchymal volume loss.  Vascular: As below. Skull and upper cervical spine: Normal marrow signal. Sinuses/Orbits: No significant abnormal signal of paranasal sinuses or mastoid air cells. Bilateral intra-ocular lens replacement. Other: None. MRA HEAD FINDINGS Internal carotid arteries:  Patent. Anterior cerebral arteries: Patent. Left larger than right A1 segments and anterior communicating artery, normal variant. Middle cerebral arteries: Patent.  Mild left proximal M1 stenosis. Anterior  communicating artery: Patent. Posterior communicating arteries: Patent. Fetal left PCA. Prominent infundibulum of left ICA origin. Posterior cerebral arteries: Patent. Accessory right PCA arising from posterior communicating artery. Basilar artery: Patent. Mid basilar segment of moderate to severe stenosis. Vertebral arteries:  Patent. No evidence of high-grade stenosis, large vessel occlusion, or aneurysm unless noted above. IMPRESSION: MRI head: 1. No acute intracranial abnormality identified. 2. Mild for age chronic microvascular ischemic changes and mild parenchymal volume loss of the brain. MRA head: 1. Patent anterior and posterior circulation. No large vessel occlusion or aneurysm. 2. Midbasilar short segment of moderate to severe stenosis. Electronically Signed   By: Mitzi HansenLance  Furusawa-Stratton M.D.   On: 09/26/2017 05:39   Mr Shirlee LatchMra Head WNWo Contrast  Result Date: 09/26/2017 CLINICAL DATA:  84 y/o F; acute numbness and weakness involving the right lower extremity. EXAM: MRI HEAD WITHOUT CONTRAST MRA HEAD WITHOUT CONTRAST TECHNIQUE: Multiplanar, multiecho pulse sequences of the brain and surrounding structures were obtained without intravenous contrast. Angiographic images of the head were obtained using MRA technique without contrast. COMPARISON:  09/26/2017 CT head FINDINGS: MRI HEAD FINDINGS Brain: No acute infarction, hemorrhage, hydrocephalus, extra-axial collection or mass lesion. Fewnonspecific foci of T2 FLAIR hyperintense signal abnormality in subcortical and periventricular white matter are compatible withmildchronic microvascular ischemic changes for age. Mildbrain parenchymal volume loss. Vascular: As below. Skull and upper cervical spine: Normal marrow signal. Sinuses/Orbits: No significant abnormal signal of paranasal sinuses or mastoid air cells. Bilateral intra-ocular lens replacement. Other: None. MRA HEAD FINDINGS Internal carotid arteries:  Patent. Anterior cerebral arteries: Patent. Left  larger than right A1 segments and anterior communicating artery, normal variant. Middle cerebral arteries: Patent.  Mild left proximal M1 stenosis. Anterior communicating artery: Patent. Posterior communicating arteries: Patent. Fetal left PCA. Prominent infundibulum of left ICA origin. Posterior cerebral arteries: Patent. Accessory right PCA arising from posterior communicating artery. Basilar artery: Patent. Mid basilar segment of moderate to severe stenosis. Vertebral arteries:  Patent. No evidence of high-grade stenosis, large vessel occlusion, or aneurysm unless noted above. IMPRESSION: MRI head: 1. No acute intracranial abnormality identified. 2. Mild for age chronic microvascular ischemic changes and mild parenchymal volume loss of the brain. MRA head: 1. Patent anterior and posterior circulation. No large vessel occlusion or aneurysm. 2. Midbasilar short segment of moderate to severe stenosis. Electronically Signed   By: Mitzi HansenLance  Furusawa-Stratton M.D.   On: 09/26/2017 05:39   Dg Hip Unilat With Pelvis 2-3 Views Right  Result Date: 09/26/2017 CLINICAL DATA:  Right hip pain EXAM: DG HIP (WITH OR WITHOUT PELVIS) 2-3V RIGHT COMPARISON:  None. FINDINGS: Generalized osteopenia. No right hip fracture or dislocation. Left hip arthroplasty without failure or complication. Lower lumbar spine spondylosis. There is peripheral vascular atherosclerotic disease. IMPRESSION: 1. No acute osseous injury of the right hip. Given the patient's age and osteopenia, if there is persistent clinical concern for an occult hip fracture, a MRI of the hip is recommended for increased sensitivity. Electronically Signed   By: Elige KoHetal  Patel   On: 09/26/2017 09:54      Subjective: Patient states that she feels back to her usual.  Denies  any further right lower extremity weakness, numbness or pain.  Denies weakness in upper extremities.  Discharge Exam:  Vitals:   09/27/17 0123 09/27/17 0556 09/27/17 1000 09/27/17 1400  BP: (!)  145/67 (!) 160/65 (!) 166/87 (!) 158/79  Pulse: 60 60 84 77  Resp:  16 18 17   Temp: 98.4 F (36.9 C) 98.1 F (36.7 C) (!) 97.4 F (36.3 C) 97.6 F (36.4 C)  TempSrc: Oral Oral Oral Oral  SpO2: 98% 98% 98% 97%    General exam: Pleasant elderly female, small built and thinly nourished, lying comfortably propped up in bed. Respiratory system: Clear to auscultation. Respiratory effort normal. Cardiovascular system: S1 & S2 heard, RRR. No JVD, murmurs, rubs, gallops or clicks. No pedal edema.  Telemetry: SB in the 50s-SR in the 60s with BBB morphology.. Gastrointestinal system: Abdomen is nondistended, soft and nontender. No organomegaly or masses felt. Normal bowel sounds heard. Central nervous system: Alert and oriented. No focal neurological deficits. Extremities: Symmetric 5 x 5 power in all extremities today. Skin: No rashes, lesions or ulcers Psychiatry: Judgement and insight appear normal. Mood & affect appropriate.       The results of significant diagnostics from this hospitalization (including imaging, microbiology, ancillary and laboratory) are listed below for reference.     Labs: CBC: Recent Labs  Lab 09/26/17 0032  WBC 8.9  NEUTROABS 5.4  HGB 11.3*  HCT 34.5*  MCV 95.6  PLT 169   Basic Metabolic Panel: Recent Labs  Lab 09/26/17 0032  NA 138  K 4.0  CL 102  CO2 26  GLUCOSE 108*  BUN 29*  CREATININE 1.48*  CALCIUM 9.2   Liver Function Tests: Recent Labs  Lab 09/26/17 0032  AST 16  ALT 10*  ALKPHOS 55  BILITOT 0.9  PROT 5.6*  ALBUMIN 3.3*   Hgb A1c Recent Labs    09/26/17 0250  HGBA1C 5.9*   Lipid Profile Recent Labs    09/26/17 0250  CHOL 173  HDL 84  LDLCALC 79  TRIG 48  CHOLHDL 2.1   Urinalysis    Component Value Date/Time   COLORURINE YELLOW 09/25/2017 0808   APPEARANCEUR CLOUDY (A) 09/25/2017 0808   LABSPEC 1.012 09/25/2017 0808   PHURINE 8.0 09/25/2017 0808   GLUCOSEU NEGATIVE 09/25/2017 0808   HGBUR SMALL (A)  09/25/2017 0808   BILIRUBINUR NEGATIVE 09/25/2017 0808   KETONESUR NEGATIVE 09/25/2017 0808   PROTEINUR NEGATIVE 09/25/2017 0808   NITRITE NEGATIVE 09/25/2017 0808   LEUKOCYTESUR SMALL (A) 09/25/2017 0808   Discussed in detail with patient's son.  Updated care and answered questions.   Time coordinating discharge: Less than 30 minutes  SIGNED:  Marcellus Scott, MD, FACP, [facility]. [facility] Pager [phone]  If 7PM-7AM, please contact night-coverage [email or web address] Password [email or web address] 09/27/2017, 5:04 PM

## 2017-09-27 NOTE — Discharge Instructions (Signed)

## 2017-09-27 NOTE — Progress Notes (Signed)
Physical Therapy Treatment Patient Details Name: Jennifer Pittman MRN: [id number] DOB: 09/20/1917 Today's Date: 09/27/2017    History of Present Illness Pt is a 84 y/o female admitted secondary to RLE weakness. MRI and CT negative for acute abnormality. Hip imaging negative for acute abnormality as well. PMH includes CVA, HTN, L THA, CKD IV.     PT Comments    Patient progressing well towards PT goals. Tolerated gait training today with Min A for balance/safety. Pt fatigues very quickly and reports feeling very weak. Pt with posterior LOB upon standing requiring external support to prevent LOB. Pain seems to have resolved as well as sensation in RLE. Pt not safe to be up mobilizing without hands on assist. HIGH FALL RISK. Recommend 24/7 supervision/assist and Home First follow up. CM notified. Will follow.   Follow Up Recommendations  Home health PT(Home First)     Equipment Recommendations  None recommended by PT    Recommendations for Other Services       Precautions / Restrictions Precautions Precautions: Fall Restrictions Weight Bearing Restrictions: No    Mobility  Bed Mobility Overal bed mobility: Needs Assistance Bed Mobility: Supine to Sit     Supine to sit: Min guard;HOB elevated     General bed mobility comments: No physical assist needed, use of rail.   Transfers Overall transfer level: Needs assistance Equipment used: Rolling walker (2 wheeled) Transfers: Sit to/from [employer] CorporationStand;Stand Pivot Transfers Sit to Stand: Min guard Stand pivot transfers: Min assist       General transfer comment: Min guard for safety. Stood from [employer] Morgan EnergyEOB x2, from chair x1, SPT chair to bed x1 Min A for balance.  Ambulation/Gait Ambulation/Gait assistance: Min assist Ambulation Distance (Feet): 75 Feet Assistive device: Rolling walker (2 wheeled) Gait Pattern/deviations: Step-through pattern;Decreased stride length Gait velocity: Decreased    General Gait Details: Slow, mildly  unsteady gait with LLE internally rotated (which is baseline). Fatigues quickly. HR stable.    Stairs            Wheelchair Mobility    Modified Rankin (Stroke Patients Only)       Balance Overall balance assessment: Needs assistance Sitting-balance support: Feet supported;No upper extremity supported Sitting balance-Leahy Scale: Good Sitting balance - Comments: Able to reach outside BoS and donn socks and underwear without LOB.   Standing balance support: During functional activity;Single extremity supported Standing balance-Leahy Scale: Fair Standing balance comment: Reliant on UE support for standing. Posterior lean when donning underwear and when first standing up. Able to stand at sink and brush hair with Min guard assist.                             Cognition Arousal/Alertness: Awake/alert Behavior During Therapy: WFL for tasks assessed/performed Overall Cognitive Status: Within Functional Limits for tasks assessed                                        Exercises      General Comments General comments (skin integrity, edema, etc.): Son and daughter in law present for session.      Pertinent Vitals/Pain Pain Assessment: Faces Faces Pain Scale: No hurt    Home Living                      Prior Function  PT Goals (current goals can now be found in the care plan section) Progress towards PT goals: Progressing toward goals    Frequency    Min 2X/week      PT Plan Current plan remains appropriate    Co-evaluation PT/OT/SLP Co-Evaluation/Treatment: Yes Reason for Co-Treatment: For patient/therapist safety;To address functional/ADL transfers          AM-PAC PT "6 Clicks" Daily Activity  Outcome Measure  Difficulty turning over in bed (including adjusting bedclothes, sheets and blankets)?: None Difficulty moving from lying on back to sitting on the side of the bed? : None Difficulty sitting down  on and standing up from a chair with arms (e.g., wheelchair, bedside commode, etc,.)?: A Little Help needed moving to and from a bed to chair (including a wheelchair)?: A Little Help needed walking in hospital room?: A Little Help needed climbing 3-5 steps with a railing? : A Lot 6 Click Score: 19    End of Session Equipment Utilized During Treatment: Gait belt Activity Tolerance: Patient limited by fatigue Patient left: in bed;with call bell/phone within reach;with bed alarm set;with family/visitor present Nurse Communication: Mobility status PT Visit Diagnosis: Unsteadiness on feet (R26.81);Muscle weakness (generalized) (M62.81)     Time: 1610-9604 PT Time Calculation (min) (ACUTE ONLY): 26 min  Charges:  $Gait Training: 8-22 mins                    G Codes:       Mylo Red, PT, DPT [phone]     Jennifer Pittman 09/27/2017, 3:21 PM

## 2017-09-27 NOTE — Consult Note (Signed)
   [facility]   09/27/2017  Jennifer Pittman 03/12/1918 [id number]    Made aware by Kandee Keenory with [facility] that [facility].  Advised to speak with patient's son Molinda BailiffJohn Pittman to discuss potential [facility] assistance.   Spoke with Jennifer BlakesJohn Pittman at [phone] just as patient was being discharged.   Explained that [facility] would be involved if pharmacy or transportation needs are identified [facility].   Jennifer Pittman is agreeable and verbal consent was obtained. Also discussed that [facility] could potentially follow in the future should disease management or community resource needs are identified once [facility]. Jennifer Pittman agreeable.  Jennifer Pittman expressed appreciation of the call.  Will notify [facility] office of patient[facility].   Jennifer Nobletika Noralee Dutko, MSN-Ed, RN,BSN [facility] Care Management Hospital Liaison [phone]

## 2017-09-27 NOTE — Care Management Note (Signed)
Case Management Note  Patient Details  Name: Jennifer Pittman MRN: [id number] Date of Birth: 05/26/1918  Subjective/Objective:     Pt in with weakness of the right leg. She is from home with family and uses a walker at baseline.                Action/Plan: PT recommending SNF. Pt will need 2 more nights to qualify for rehab under her Medicare. CM following for d/c disposition.  Expected Discharge Date:  09/30/17               Expected Discharge Plan:  Skilled Nursing Facility  In-House Referral:  Clinical Social Work  Discharge planning Services     Post Acute Care Choice:    Choice offered to:     DME Arranged:    DME Agency:     HH Arranged:    HH Agency:     Status of Service:  In process, will continue to follow  If discussed at Long Length of Stay Meetings, dates discussed:    Additional Comments:  Kermit BaloKelli F Tao Satz, RN 09/27/2017, 10:29 AM

## 2017-09-27 NOTE — Evaluation (Signed)
Clinical/Bedside Swallow Evaluation Patient Details  Name: Jennifer Pittman MRN: [id number] Date of Birth: 09/26/1917  Today's Date: 09/27/2017 Time: SLP Start Time (ACUTE ONLY): 0955 SLP Stop Time (ACUTE ONLY): 1025 SLP Time Calculation (min) (ACUTE ONLY): 30 min  Past Medical History:  Past Medical History:  Diagnosis Date  . B12 deficiency 10/26/2015  . Bundle branch block, left   . CKD (chronic kidney disease), stage IV (HCC) 10/26/2015  . Hypertension   . Hypothyroidism 10/26/2015  . Osteoarthritis   . Pernicious anemia   . Stroke (HCC)   . Thyroid disease    Past Surgical History:  Past Surgical History:  Procedure Laterality Date  . ABDOMINAL HYSTERECTOMY    . hyterectomy    . TOTAL HIP ARTHROPLASTY Left    HPI:  Pt is a 84 y/o female admitted secondary to RLE weakness. MRI and CT negative for acute abnormality. Hip imaging negative for acute abnormality as well. PMH includes CVA, HTN, L THA, CKD IV.   Pt/family report increasing difficulty swallowing last several years, describing prolific phlegm production, intermittent coughing with meals, no acute changes - this has been a chronic issue.    Assessment / Plan / Recommendation Clinical Impression  Pt presents with s/s consistent with an esophageal dysphagia, marked by reports of globus, multiple swallows required with each food bolus.  No coughing during exam, no oral-motor deficits.  Pt describes swallowing certain foods to "push down" other food items.  Discussed swallow with pt, son and dtr-in-law.  An MBS or barium swallow would help deliniate source of deficits, but pt would not want to pursue dilatation or other forms of treatment should they be recommended.  Pt/family declined further intervention, including swallow studies. Discussed with family the potential for aspiration and its consequences.  We discussed basic strategies (HOB elevation at night and not eating two hours before bedtime; softer foods). Continue current  diet.   Speech/language assessment ordered.  Pt is at baseline per screen.   No further SLP f/u is warranted - our services will sign off.  SLP Visit Diagnosis: Dysphagia, unspecified (R13.10)    Aspiration Risk       Diet Recommendation   continue regular solids, thin liquids  Medication Administration: Whole meds with liquid    Other  Recommendations Oral Care Recommendations: Oral care BID   Follow up Recommendations        Frequency and Duration            Prognosis        Swallow Study   General Date of Onset: 09/27/17 HPI: Pt is a 84 y/o female admitted secondary to RLE weakness. MRI and CT negative for acute abnormality. Hip imaging negative for acute abnormality as well. PMH includes CVA, HTN, L THA, CKD IV.   Pt/family report increasing difficulty swallowing last several years, describing prolific phlegm production, intermittent coughing with meals.  Type of Study: Bedside Swallow Evaluation Previous Swallow Assessment: no Diet Prior to this Study: Regular;Thin liquids Temperature Spikes Noted: No Respiratory Status: Room air History of Recent Intubation: No Behavior/Cognition: Alert;Cooperative Oral Cavity Assessment: Within Functional Limits Oral Care Completed by SLP: No Oral Cavity - Dentition: Adequate natural dentition Vision: Functional for self-feeding Self-Feeding Abilities: Able to feed self Patient Positioning: Upright in bed Baseline Vocal Quality: Normal Volitional Cough: Strong Volitional Swallow: Able to elicit    Oral/Motor/Sensory Function Overall Oral Motor/Sensory Function: Within functional limits   Ice Chips Ice chips: Within functional limits   Thin Liquid Thin  Liquid: Impaired Presentation: Straw Pharyngeal  Phase Impairments: Multiple swallows    Nectar Thick Nectar Thick Liquid: Not tested   Honey Thick Honey Thick Liquid: Not tested   Puree Puree: Impaired Presentation: Spoon Pharyngeal Phase Impairments: Multiple swallows    Solid   GO   Solid: Impaired Pharyngeal Phase Impairments: Multiple swallows        Jennifer Pittman 09/27/2017,10:52 AM

## 2017-09-27 NOTE — Progress Notes (Signed)
OT Evaluation  Recommend pt DC home with 24/7 assistance and HHOT.    09/27/17 1600  OT Visit Information  Last OT Received On 09/27/17  Assistance Needed +1  PT/OT/SLP Co-Evaluation/Treatment Yes  Reason for Co-Treatment For patient/therapist safety  History of Present Illness Pt is a 84 y/o female admitted secondary to RLE weakness. MRI and CT negative for acute abnormality. Hip imaging negative for acute abnormality as well. PMH includes CVA, HTN, L THA, CKD IV.   Precautions  Precautions Fall  Restrictions  Weight Bearing Restrictions No  Home Living  Family/patient expects to be discharged to: [place]  Available Help at Discharge Family;Available 24 hours/day  Type of Home House  Home Access Stairs to enter  Entrance Stairs-Number of Steps 3  Entrance Stairs-Rails Left  Home Layout One level  Bathroom Biomedical scientist Yes  How Accessible Accessible via walker  [place] - 2 wheels;Shower seat;Grab bars - tub/shower;Hand held shower head  Additional Comments Son and his wife not able to physically assist.   Prior Function  Level of Independence Independent with assistive device(s)  Comments Uses RW at home   Communication  Communication No difficulties  Pain Assessment  Faces Pain Scale 0  Pain Location RLE   Pain Descriptors / Indicators Aching;Grimacing  Pain Intervention(s) Limited activity within patient's tolerance  Cognition  Arousal/Alertness Awake/alert  Behavior During Therapy WFL for tasks assessed/performed  Overall Cognitive Status History of cognitive impairments - at baseline  Upper Extremity Assessment  Upper Extremity Assessment Generalized weakness  Lower Extremity Assessment  Lower Extremity Assessment Defer to PT evaluation  RLE Deficits / Details Decreased strength noted in RLE; grossly 3+/5 throughout. Unable to achieve full  knee extension in standing. Reports sensation is better   Cervical / Trunk Assessment  Cervical / Trunk Assessment Kyphotic  ADL  Overall ADL's  Needs assistance/impaired  Eating/Feeding Modified independent  Grooming Min guard;Standing  Upper Body Bathing Set up;Supervision/ safety;Sitting  Lower Body Bathing Minimal assistance;Sit to/from stand  Upper Body Dressing  Minimal assistance;Sitting  Lower Body Dressing Minimal assistance;Sit to/from stand  Toilet Transfer Minimal assistance;RW;Ambulation  Toileting- Clothing Manipulation and Hygiene Minimal assistance;Sit to/from stand  Functional mobility during ADLs Minimal assistance;Rolling walker;Cueing for safety;Cueing for sequencing  Bed Mobility  Overal bed mobility Needs Assistance  Bed Mobility Supine to Sit  Rolling Supervision  Supine to sit HOB elevated;Supervision  Transfers  Overall transfer level Needs assistance  Equipment used Rolling walker (2 wheeled)  Transfers Sit to/from [employer] Transfers  Sit to Stand Min assist  Stand pivot transfers Min assist  Balance  Overall balance assessment Needs assistance  Sitting-balance support Feet supported;No upper extremity supported  Sitting balance-Leahy Scale Good  Sitting balance - Comments Able to reach outside BoS and donn socks and underwear without LOB.  Standing balance support During functional activity;Single extremity supported  Standing balance-Leahy Scale Fair  Standing balance comment Reliant on UE support for standing. Posterior lean when donning underwear and when first standing up. Able to stand at sink and brush hair with Min guard assist. LOB posteriorly during LB dresssing  OT - End of Session  Equipment Utilized During Treatment Gait belt;Rolling walker  Activity Tolerance Patient tolerated treatment well  Patient left in chair;with call bell/phone within reach;with nursing/sitter in room  Nurse Communication Mobility status;Other (comment) (DC  needs)  OT Assessment  OT Recommendation/Assessment All further OT needs can be  met in the next venue of care  OT Visit Diagnosis Unsteadiness on feet (R26.81);Muscle weakness (generalized) (M62.81);History of falling (Z91.81)  OT Problem List Decreased strength;Decreased activity tolerance;Impaired balance (sitting and/or standing);Decreased coordination;Decreased safety awareness;Decreased knowledge of use of DME or AE  AM-PAC OT "6 Clicks" Daily Activity Outcome Measure  Help from another person eating meals? 4  Help from another person taking care of personal grooming? 4  Help from another person toileting, which includes using toliet, bedpan, or urinal? 3  Help from another person bathing (including washing, rinsing, drying)? 3  Help from another person to put on and taking off regular upper body clothing? 3  Help from another person to put on and taking off regular lower body clothing? 3  6 Click Score 20  ADL G Code Conversion CJ  OT Recommendation  Follow Up Recommendations Home health OT;Supervision/Assistance - 24 hour  OT Equipment None recommended by OT  Individuals Consulted  Consulted and Agree with Results and Recommendations Patient;Family member/caregiver  Family Member Consulted son  Acute Rehab OT Goals  Patient Stated Goal to get stronger   OT Goal Formulation With patient/family  OT Time Calculation  OT Start Time (ACUTE ONLY) 1312  OT Stop Time (ACUTE ONLY) 1330  OT Time Calculation (min) 18 min  OT General Charges  $OT Visit 1 Visit  OT Evaluation  $OT Eval Moderate Complexity 1 Mod  Written Expression  Dominant Hand Right  [facility], OT/L  [phone] 09/27/2017

## 2017-09-27 NOTE — Care Management Note (Signed)
Case Management Note  Patient Details  Name: Jennifer Pittman MRN: [id number] Date of Birth: Aug 06, 1918  Subjective/Objective:                    Action/Plan: Pt discharging home with orders for [facility] services. CM met with the patient, her son and daughter in law. CM inquired about the Home first program through [facility]. The family was very interested. Cory with [facility] notified and will talk with the son.  Family asking for a walker for home. Butch Penny with [facility] D[facility] notified and will deliver the DME to the room.  Family will provide transportation home.   Expected Discharge Date:  09/27/17               Expected Discharge Plan:  Skilled Nursing Facility  In-House Referral:  Clinical Social Work  Discharge planning Services  CM Consult  Post Acute Care Choice:  Durable Medical Equipment, Home Health Choice offered to:  Adult Children  DME Arranged:  Walker rolling DME Agency:  [facility] Arranged:  RN, PT, OT, Social Work [employer] Agency:  [facility](home first)  Status of Service:  Completed, signed off  If discussed at [employer], dates discussed:    Additional Comments:  Pollie Friar, RN 09/27/2017, 2:35 PM

## 2017-09-28 DIAGNOSIS — M25551 Pain in right hip: Secondary | ICD-10-CM | POA: Diagnosis not present

## 2017-09-28 DIAGNOSIS — R531 Weakness: Secondary | ICD-10-CM | POA: Diagnosis not present

## 2017-09-30 DIAGNOSIS — M25551 Pain in right hip: Secondary | ICD-10-CM | POA: Diagnosis not present

## 2017-09-30 DIAGNOSIS — R531 Weakness: Secondary | ICD-10-CM | POA: Diagnosis not present

## 2017-10-01 DIAGNOSIS — R531 Weakness: Secondary | ICD-10-CM | POA: Diagnosis not present

## 2017-10-01 DIAGNOSIS — M25551 Pain in right hip: Secondary | ICD-10-CM | POA: Diagnosis not present

## 2017-10-02 DIAGNOSIS — R531 Weakness: Secondary | ICD-10-CM | POA: Diagnosis not present

## 2017-10-02 DIAGNOSIS — M25551 Pain in right hip: Secondary | ICD-10-CM | POA: Diagnosis not present

## 2017-10-03 DIAGNOSIS — M25551 Pain in right hip: Secondary | ICD-10-CM | POA: Diagnosis not present

## 2017-10-03 DIAGNOSIS — R531 Weakness: Secondary | ICD-10-CM | POA: Diagnosis not present

## 2017-10-07 DIAGNOSIS — R7309 Other abnormal glucose: Secondary | ICD-10-CM | POA: Diagnosis not present

## 2017-10-07 DIAGNOSIS — I129 Hypertensive chronic kidney disease with stage 1 through stage 4 chronic kidney disease, or unspecified chronic kidney disease: Secondary | ICD-10-CM | POA: Diagnosis not present

## 2017-10-07 DIAGNOSIS — M25551 Pain in right hip: Secondary | ICD-10-CM | POA: Diagnosis not present

## 2017-10-07 DIAGNOSIS — E782 Mixed hyperlipidemia: Secondary | ICD-10-CM | POA: Diagnosis not present

## 2017-10-07 DIAGNOSIS — R531 Weakness: Secondary | ICD-10-CM | POA: Diagnosis not present

## 2017-10-08 DIAGNOSIS — R531 Weakness: Secondary | ICD-10-CM | POA: Diagnosis not present

## 2017-10-08 DIAGNOSIS — M25551 Pain in right hip: Secondary | ICD-10-CM | POA: Diagnosis not present

## 2017-10-09 DIAGNOSIS — R531 Weakness: Secondary | ICD-10-CM | POA: Diagnosis not present

## 2017-10-09 DIAGNOSIS — M25551 Pain in right hip: Secondary | ICD-10-CM | POA: Diagnosis not present

## 2017-10-10 DIAGNOSIS — R531 Weakness: Secondary | ICD-10-CM | POA: Diagnosis not present

## 2017-10-10 DIAGNOSIS — M25551 Pain in right hip: Secondary | ICD-10-CM | POA: Diagnosis not present

## 2017-10-14 DIAGNOSIS — N184 Chronic kidney disease, stage 4 (severe): Secondary | ICD-10-CM | POA: Diagnosis not present

## 2017-10-14 DIAGNOSIS — R531 Weakness: Secondary | ICD-10-CM | POA: Diagnosis not present

## 2017-10-14 DIAGNOSIS — M25551 Pain in right hip: Secondary | ICD-10-CM | POA: Diagnosis not present

## 2017-10-14 DIAGNOSIS — I129 Hypertensive chronic kidney disease with stage 1 through stage 4 chronic kidney disease, or unspecified chronic kidney disease: Secondary | ICD-10-CM | POA: Diagnosis not present

## 2017-10-15 DIAGNOSIS — M25551 Pain in right hip: Secondary | ICD-10-CM | POA: Diagnosis not present

## 2017-10-15 DIAGNOSIS — R531 Weakness: Secondary | ICD-10-CM | POA: Diagnosis not present

## 2017-10-16 DIAGNOSIS — M25551 Pain in right hip: Secondary | ICD-10-CM | POA: Diagnosis not present

## 2017-10-16 DIAGNOSIS — R531 Weakness: Secondary | ICD-10-CM | POA: Diagnosis not present

## 2017-10-17 DIAGNOSIS — R531 Weakness: Secondary | ICD-10-CM | POA: Diagnosis not present

## 2017-10-17 DIAGNOSIS — M25551 Pain in right hip: Secondary | ICD-10-CM | POA: Diagnosis not present

## 2017-10-22 DIAGNOSIS — R531 Weakness: Secondary | ICD-10-CM | POA: Diagnosis not present

## 2017-10-22 DIAGNOSIS — M25551 Pain in right hip: Secondary | ICD-10-CM | POA: Diagnosis not present

## 2017-11-01 DIAGNOSIS — R531 Weakness: Secondary | ICD-10-CM | POA: Diagnosis not present

## 2017-11-01 DIAGNOSIS — M25551 Pain in right hip: Secondary | ICD-10-CM | POA: Diagnosis not present

## 2017-12-04 DIAGNOSIS — H353132 Nonexudative age-related macular degeneration, bilateral, intermediate dry stage: Secondary | ICD-10-CM | POA: Diagnosis not present

## 2017-12-04 DIAGNOSIS — Z961 Presence of intraocular lens: Secondary | ICD-10-CM | POA: Diagnosis not present

## 2017-12-04 DIAGNOSIS — H401133 Primary open-angle glaucoma, bilateral, severe stage: Secondary | ICD-10-CM | POA: Diagnosis not present

## 2017-12-04 DIAGNOSIS — H04123 Dry eye syndrome of bilateral lacrimal glands: Secondary | ICD-10-CM | POA: Diagnosis not present

## 2017-12-26 DIAGNOSIS — E782 Mixed hyperlipidemia: Secondary | ICD-10-CM | POA: Diagnosis not present

## 2017-12-26 DIAGNOSIS — M150 Primary generalized (osteo)arthritis: Secondary | ICD-10-CM | POA: Diagnosis not present

## 2017-12-26 DIAGNOSIS — R531 Weakness: Secondary | ICD-10-CM | POA: Diagnosis not present

## 2017-12-26 DIAGNOSIS — I447 Left bundle-branch block, unspecified: Secondary | ICD-10-CM | POA: Diagnosis not present

## 2017-12-26 DIAGNOSIS — N390 Urinary tract infection, site not specified: Secondary | ICD-10-CM | POA: Diagnosis not present

## 2017-12-26 DIAGNOSIS — E039 Hypothyroidism, unspecified: Secondary | ICD-10-CM | POA: Diagnosis not present

## 2017-12-26 DIAGNOSIS — N184 Chronic kidney disease, stage 4 (severe): Secondary | ICD-10-CM | POA: Diagnosis not present

## 2017-12-26 DIAGNOSIS — R7309 Other abnormal glucose: Secondary | ICD-10-CM | POA: Diagnosis not present

## 2017-12-26 DIAGNOSIS — Z0000 Encounter for general adult medical examination without abnormal findings: Secondary | ICD-10-CM | POA: Diagnosis not present

## 2017-12-26 DIAGNOSIS — D510 Vitamin B12 deficiency anemia due to intrinsic factor deficiency: Secondary | ICD-10-CM | POA: Diagnosis not present

## 2017-12-26 DIAGNOSIS — I129 Hypertensive chronic kidney disease with stage 1 through stage 4 chronic kidney disease, or unspecified chronic kidney disease: Secondary | ICD-10-CM | POA: Diagnosis not present

## 2018-01-19 ENCOUNTER — Encounter (HOSPITAL_COMMUNITY): Payer: Self-pay

## 2018-01-19 ENCOUNTER — Emergency Department (HOSPITAL_COMMUNITY): Payer: Medicare Other

## 2018-01-19 ENCOUNTER — Other Ambulatory Visit: Payer: Self-pay

## 2018-01-19 ENCOUNTER — Inpatient Hospital Stay (HOSPITAL_COMMUNITY)
Admission: EM | Admit: 2018-01-19 | Discharge: 2018-01-23 | DRG: 682 | Disposition: A | Discharge status: Skilled Nursing Facility | Payer: Medicare Other | Attending: Internal Medicine | Admitting: Internal Medicine

## 2018-01-19 DIAGNOSIS — R1310 Dysphagia, unspecified: Secondary | ICD-10-CM

## 2018-01-19 DIAGNOSIS — E876 Hypokalemia: Secondary | ICD-10-CM | POA: Diagnosis not present

## 2018-01-19 DIAGNOSIS — E039 Hypothyroidism, unspecified: Secondary | ICD-10-CM | POA: Diagnosis present

## 2018-01-19 DIAGNOSIS — I5042 Chronic combined systolic (congestive) and diastolic (congestive) heart failure: Secondary | ICD-10-CM | POA: Diagnosis present

## 2018-01-19 DIAGNOSIS — N189 Chronic kidney disease, unspecified: Secondary | ICD-10-CM

## 2018-01-19 DIAGNOSIS — M1990 Unspecified osteoarthritis, unspecified site: Secondary | ICD-10-CM | POA: Diagnosis present

## 2018-01-19 DIAGNOSIS — E538 Deficiency of other specified B group vitamins: Secondary | ICD-10-CM | POA: Diagnosis present

## 2018-01-19 DIAGNOSIS — I951 Orthostatic hypotension: Secondary | ICD-10-CM | POA: Diagnosis not present

## 2018-01-19 DIAGNOSIS — Z7902 Long term (current) use of antithrombotics/antiplatelets: Secondary | ICD-10-CM

## 2018-01-19 DIAGNOSIS — E860 Dehydration: Secondary | ICD-10-CM | POA: Diagnosis not present

## 2018-01-19 DIAGNOSIS — N179 Acute kidney failure, unspecified: Secondary | ICD-10-CM | POA: Diagnosis not present

## 2018-01-19 DIAGNOSIS — R918 Other nonspecific abnormal finding of lung field: Secondary | ICD-10-CM | POA: Diagnosis not present

## 2018-01-19 DIAGNOSIS — R001 Bradycardia, unspecified: Secondary | ICD-10-CM | POA: Diagnosis present

## 2018-01-19 DIAGNOSIS — Z681 Body mass index (BMI) 19 or less, adult: Secondary | ICD-10-CM

## 2018-01-19 DIAGNOSIS — B9620 Unspecified Escherichia coli [E. coli] as the cause of diseases classified elsewhere: Secondary | ICD-10-CM | POA: Diagnosis present

## 2018-01-19 DIAGNOSIS — R8290 Unspecified abnormal findings in urine: Secondary | ICD-10-CM | POA: Diagnosis present

## 2018-01-19 DIAGNOSIS — D631 Anemia in chronic kidney disease: Secondary | ICD-10-CM | POA: Diagnosis present

## 2018-01-19 DIAGNOSIS — I429 Cardiomyopathy, unspecified: Secondary | ICD-10-CM | POA: Diagnosis present

## 2018-01-19 DIAGNOSIS — E43 Unspecified severe protein-calorie malnutrition: Secondary | ICD-10-CM

## 2018-01-19 DIAGNOSIS — I214 Non-ST elevation (NSTEMI) myocardial infarction: Secondary | ICD-10-CM | POA: Diagnosis present

## 2018-01-19 DIAGNOSIS — K222 Esophageal obstruction: Secondary | ICD-10-CM | POA: Diagnosis present

## 2018-01-19 DIAGNOSIS — R627 Adult failure to thrive: Secondary | ICD-10-CM | POA: Diagnosis present

## 2018-01-19 DIAGNOSIS — I447 Left bundle-branch block, unspecified: Secondary | ICD-10-CM | POA: Diagnosis present

## 2018-01-19 DIAGNOSIS — I2510 Atherosclerotic heart disease of native coronary artery without angina pectoris: Secondary | ICD-10-CM | POA: Diagnosis present

## 2018-01-19 DIAGNOSIS — R531 Weakness: Secondary | ICD-10-CM

## 2018-01-19 DIAGNOSIS — Z90710 Acquired absence of both cervix and uterus: Secondary | ICD-10-CM

## 2018-01-19 DIAGNOSIS — R4702 Dysphasia: Secondary | ICD-10-CM | POA: Diagnosis present

## 2018-01-19 DIAGNOSIS — N184 Chronic kidney disease, stage 4 (severe): Secondary | ICD-10-CM | POA: Diagnosis present

## 2018-01-19 DIAGNOSIS — Z5329 Procedure and treatment not carried out because of patient's decision for other reasons: Secondary | ICD-10-CM | POA: Diagnosis present

## 2018-01-19 DIAGNOSIS — N3000 Acute cystitis without hematuria: Secondary | ICD-10-CM | POA: Diagnosis not present

## 2018-01-19 DIAGNOSIS — Z96642 Presence of left artificial hip joint: Secondary | ICD-10-CM | POA: Diagnosis present

## 2018-01-19 DIAGNOSIS — R296 Repeated falls: Secondary | ICD-10-CM | POA: Diagnosis present

## 2018-01-19 DIAGNOSIS — R0902 Hypoxemia: Secondary | ICD-10-CM | POA: Diagnosis not present

## 2018-01-19 DIAGNOSIS — I130 Hypertensive heart and chronic kidney disease with heart failure and stage 1 through stage 4 chronic kidney disease, or unspecified chronic kidney disease: Secondary | ICD-10-CM | POA: Diagnosis present

## 2018-01-19 DIAGNOSIS — R1314 Dysphagia, pharyngoesophageal phase: Secondary | ICD-10-CM | POA: Diagnosis present

## 2018-01-19 DIAGNOSIS — Z79890 Hormone replacement therapy (postmenopausal): Secondary | ICD-10-CM

## 2018-01-19 DIAGNOSIS — Z66 Do not resuscitate: Secondary | ICD-10-CM | POA: Diagnosis present

## 2018-01-19 DIAGNOSIS — E785 Hyperlipidemia, unspecified: Secondary | ICD-10-CM | POA: Diagnosis present

## 2018-01-19 DIAGNOSIS — Z8673 Personal history of transient ischemic attack (TIA), and cerebral infarction without residual deficits: Secondary | ICD-10-CM

## 2018-01-19 DIAGNOSIS — Z79899 Other long term (current) drug therapy: Secondary | ICD-10-CM

## 2018-01-19 DIAGNOSIS — R1319 Other dysphagia: Secondary | ICD-10-CM

## 2018-01-19 LAB — BASIC METABOLIC PANEL
Anion gap: 12 (ref 5–15)
BUN: 52 mg/dL — ABNORMAL HIGH (ref 6–20)
CO2: 30 mmol/L (ref 22–32)
CREATININE: 1.83 mg/dL — AB (ref 0.44–1.00)
Calcium: 8.4 mg/dL — ABNORMAL LOW (ref 8.9–10.3)
Chloride: 95 mmol/L — ABNORMAL LOW (ref 101–111)
GFR, EST AFRICAN AMERICAN: 25 mL/min — AB (ref >60)
GFR, EST NON AFRICAN AMERICAN: 22 mL/min — AB (ref >60)
Glucose, Bld: 127 mg/dL — ABNORMAL HIGH (ref 65–99)
POTASSIUM: 3.2 mmol/L — AB (ref 3.5–5.1)
SODIUM: 137 mmol/L (ref 135–145)

## 2018-01-19 LAB — URINALYSIS, ROUTINE W REFLEX MICROSCOPIC
Bilirubin Urine: NEGATIVE
Glucose, UA: NEGATIVE mg/dL
Ketones, ur: 5 mg/dL — AB
LEUKOCYTES UA: NEGATIVE
NITRITE: POSITIVE — AB
Protein, ur: 30 mg/dL — AB
SPECIFIC GRAVITY, URINE: 1.017 (ref 1.005–1.030)
pH: 6 (ref 5.0–8.0)

## 2018-01-19 LAB — CBC
HCT: 33.8 % — ABNORMAL LOW (ref 36.0–46.0)
Hemoglobin: 11.1 g/dL — ABNORMAL LOW (ref 12.0–15.0)
MCH: 31.3 pg (ref 26.0–34.0)
MCHC: 32.8 g/dL (ref 30.0–36.0)
MCV: 95.2 fL (ref 78.0–100.0)
PLATELETS: 169 10*3/uL (ref 150–400)
RBC: 3.55 MIL/uL — AB (ref 3.87–5.11)
RDW: 13.6 % (ref 11.5–15.5)
WBC: 9.5 10*3/uL (ref 4.0–10.5)

## 2018-01-19 MED ORDER — SODIUM CHLORIDE 0.9 % IV SOLN
1.0000 g | Freq: Once | INTRAVENOUS | Status: AC
  Administered 2018-01-19: 1 g via INTRAVENOUS
  Filled 2018-01-19: qty 10

## 2018-01-19 MED ORDER — SODIUM CHLORIDE 0.9 % IV BOLUS
1000.0000 mL | Freq: Once | INTRAVENOUS | Status: AC
  Administered 2018-01-19: 1000 mL via INTRAVENOUS

## 2018-01-19 MED ORDER — SODIUM CHLORIDE 0.9 % IV SOLN
Freq: Once | INTRAVENOUS | Status: AC
  Administered 2018-01-19: 23:00:00 via INTRAVENOUS

## 2018-01-19 NOTE — ED Triage Notes (Signed)
Pt arrives from home via EMS--per EMS pt has had increased weakness, falls, loss of appetite x2 weeks. Orthostatic with EMS, 110 systolic palpated, loss radial pulses when stood up with EMS and became weak. Unable to stand up on own.  NaCl given en route with EMS.

## 2018-01-19 NOTE — ED Notes (Signed)
Bed: WA08 Expected date:  Expected time:  Means of arrival:  Comments: Ems 98 f FTT

## 2018-01-19 NOTE — H&P (Signed)
History and Physical    Jennifer Pittman ZOX:[id number] DOB: [birth year] DOA: 01/19/2018  Referring MD/NP/PA: Dr. Particia Nearing PCP: Georgianne Fick, MD  Patient coming from: home via EMS  Chief Complaint: Falls  I have personally briefly reviewed patient's old medical records in [facility]   HPI: Jennifer Pittman is a 84 y.o. female with medical history significant of HTN, HLD, hypothyroidism, dysphasia, esophageal stricture, h/o CVA, h/o rheumatic fever as a child; who presents with progressively worsening weakness.  History is obtained from the patient, and collaborated by son over the phone.  Patient had been admitted into the hospital last at the end of January for weakness.  At that time she was evaluated with CT and MRI imaging studies that were negative for any signs of acute stroke.  Speech therapy noted significant dysphagia for which patient needed  multiple swallows for each food bolus with globus sensation noted.  Since being discharged home back with family patient has had a generalized progressive decline.  She has had multiple falls per family reports. Last fall 2 weeks ago while getting out of bathroom.  Patient was not noted to have hit her head.  She has not been eating much and reports that it is difficult for her to swallow and it takes a long time.  She reports fear of having esophageal stretching for what appears to be known esophageal stricture due to pain.  Other associated symptoms include decreased urination.  Denies any significant chest pain, dysuria, abdominal pain, nausea, vomiting, diarrhea, headache, or loss of consciousness.  At this point family report is needing almost full assistance for all ADLs and they are unable to care for her at home at this time.  ED Course: Upon admission into the emergency department patient was noted to be afebrile, pulse 42-57, and all other vital signs maintained.  Labs revealed WBC 9.5, hemoglobin 11.1, potassium 3.2, BUN 52,  and creatinine 1.83.  Urinalysis was negative for leukocytes, positive for nitrites, many bacteria seen, and negative for significant squamous epithelial or WBCs.  patient was given ceftriaxone IV for presumed urinary tract infection, 1 L of normal saline IV fluids, and then placed on a rate of 125 mL/h.  Review of Systems  Constitutional: Positive for malaise/fatigue. Negative for chills and fever.  HENT: Negative for congestion and nosebleeds.   Eyes: Negative for double vision and photophobia.  Respiratory: Negative for cough and sputum production.   Cardiovascular: Positive for leg swelling. Negative for chest pain.  Gastrointestinal: Negative for abdominal pain, diarrhea, nausea and vomiting.  Genitourinary: Negative for dysuria and hematuria.  Musculoskeletal: Positive for falls. Negative for myalgias.  Neurological: Positive for weakness. Negative for tingling, focal weakness and loss of consciousness.  Psychiatric/Behavioral: Negative for memory loss and substance abuse. The patient is not nervous/anxious.     Past Medical History:  Diagnosis Date  . B12 deficiency 10/26/2015  . Bundle branch block, left   . CKD (chronic kidney disease), stage IV (HCC) 10/26/2015  . Hypertension   . Hypothyroidism 10/26/2015  . Osteoarthritis   . Pernicious anemia   . Stroke (HCC)   . Thyroid disease     Past Surgical History:  Procedure Laterality Date  . ABDOMINAL HYSTERECTOMY    . hyterectomy    . TOTAL HIP ARTHROPLASTY Left      reports that she has never smoked. She has never used smokeless tobacco. She reports that she does not drink alcohol or use drugs.  No Known Allergies  History reviewed. No pertinent family history.  Prior to Admission medications   Medication Sig Start Date End Date Taking? Authorizing Provider  Cyanocobalamin (B-12 IJ) Inject 1,000 mcg as directed every 30 (thirty) days.   Yes [provider]  dorzolamide-timolol (COSOPT) 22.3-6.8 MG/ML ophthalmic  solution Place 1 drop into both eyes 2 (two) times daily. 09/06/15  Yes [provider]  amLODipine (NORVASC) 10 MG tablet Take 10 mg by mouth daily.    [provider]  atorvastatin (LIPITOR) 10 MG tablet Take 1 tablet (10 mg total) by mouth daily at 6 PM. 10/29/15   [name], MD  Cholecalciferol (VITAMIN D) 2000 units tablet Take 2,000 Units by mouth daily.    [provider]  clopidogrel (PLAVIX) 75 MG tablet Take 1 tablet (75 mg total) by mouth daily. 03/06/16   Nilda Riggs, NP  hydrochlorothiazide (HYDRODIURIL) 50 MG tablet Take 50 mg by mouth daily.    [provider]  latanoprost (XALATAN) 0.005 % ophthalmic solution Place 1 drop into both eyes at bedtime. 09/07/15   [provider]  levothyroxine (SYNTHROID, LEVOTHROID) 112 MCG tablet Take 112 mcg by mouth daily before breakfast. 09/06/15   [provider]  Multiple Vitamin (MULTIVITAMIN WITH MINERALS) TABS tablet Take 1 tablet by mouth daily.    [provider]  telmisartan (MICARDIS) 20 MG tablet Take 20 mg by mouth daily.    [provider]    Physical Exam:  Constitutional: Cachectic appearing elderly female who is otherwise in no acute distress at this time Vitals:   01/19/18 1929 01/19/18 2000 01/19/18 2030 01/19/18 2130  BP: (!) 142/77 (!) 150/71 132/63 (!) 150/62  Pulse: (!) 49 (!) 47 (!) 42 (!) 44  Resp: Temp: (!) 97.4 F (36.3 C)     TempSrc: Oral     SpO2: 100% 100% 100% 100%  Weight:       Eyes: PERRL, lids and conjunctivae normal ENMT: Mucous membranes are dry. Posterior pharynx clear of any exudate or lesions. Dentures present Neck: normal, supple, no masses, no thyromegaly Respiratory: clear to auscultation bilaterally, no wheezing, no crackles. Normal respiratory effort. No accessory muscle use.  Cardiovascular: Bradycardic, no murmurs / rubs / gallops. No extremity edema. 2+ pedal pulses. No carotid bruits.  Abdomen:  no tenderness, no masses palpated. No hepatosplenomegaly. Bowel sounds positive.  Musculoskeletal: no clubbing / cyanosis. No joint deformity upper and lower extremities. Good ROM, no contractures. Normal muscle tone.  Skin: Thin skin.  No rashes, lesions, ulcers. No induration  Neurologic: CN 2-12 grossly intact. Sensation intact, DTR normal. Strength 5/5 in all 4.  Psychiatric: Normal judgment and insight. Alert and oriented x 3. Normal mood.     Labs on Admission: I have personally reviewed following labs and imaging studies  CBC: Recent Labs  Lab 01/19/18 1759  WBC 9.5  HGB 11.1*  HCT 33.8*  MCV 95.2  PLT 169   Basic Metabolic Panel: Recent Labs  Lab 01/19/18 1759  NA 137  K 3.2*  CL 95*  CO2 30  GLUCOSE 127*  BUN 52*  CREATININE 1.83*  CALCIUM 8.4*   GFR: CrCl cannot be calculated (Unknown ideal weight.). Liver Function Tests: No results for input(s): AST, ALT, ALKPHOS, BILITOT, PROT, ALBUMIN in the last 168 hours. No results for input(s): LIPASE, AMYLASE in the last 168 hours. No results for input(s): AMMONIA in the last 168 hours. Coagulation Profile: No results for input(s): INR, PROTIME in the  last 168 hours. Cardiac Enzymes: No results for input(s): CKTOTAL, CKMB, CKMBINDEX, TROPONINI in the last 168 hours. BNP (last 3 results) No results for input(s): PROBNP in the last 8760 hours. HbA1C: No results for input(s): HGBA1C in the last 72 hours. CBG: No results for input(s): GLUCAP in the last 168 hours. Lipid Profile: No results for input(s): CHOL, HDL, LDLCALC, TRIG, CHOLHDL, LDLDIRECT in the last 72 hours. Thyroid Function Tests: No results for input(s): TSH, T4TOTAL, FREET4, T3FREE, THYROIDAB in the last 72 hours. Anemia Panel: No results for input(s): VITAMINB12, FOLATE, FERRITIN, TIBC, IRON, RETICCTPCT in the last 72 hours. Urine analysis:    Component Value Date/Time   COLORURINE YELLOW 01/19/2018 2129   APPEARANCEUR HAZY (A) 01/19/2018 2129    LABSPEC 1.017 01/19/2018 2129   PHURINE 6.0 01/19/2018 2129   GLUCOSEU NEGATIVE 01/19/2018 2129   HGBUR SMALL (A) 01/19/2018 2129   BILIRUBINUR NEGATIVE 01/19/2018 2129   KETONESUR 5 (A) 01/19/2018 2129   PROTEINUR 30 (A) 01/19/2018 2129   NITRITE POSITIVE (A) 01/19/2018 2129   LEUKOCYTESUR NEGATIVE 01/19/2018 2129   Sepsis Labs: No results found for this or any previous visit (from the past 240 hour(s)).   Radiological Exams on Admission: Dg Chest 2 View  Result Date: 01/19/2018 CLINICAL DATA:  Weakness EXAM: CHEST - 2 VIEW COMPARISON:  10/26/2015 FINDINGS: Mild hyperinflation. Numerous leads and wires project over the chest. Midline trachea. Mild cardiomegaly. Atherosclerosis in the transverse aorta. Tortuous thoracic aorta. No pleural effusion or pneumothorax. No congestive failure. Clear lungs. IMPRESSION: No acute cardiopulmonary disease. Cardiomegaly without congestive failure. Aortic Atherosclerosis (ICD10-I70.0). Hyperinflation. Electronically Signed   By: Jeronimo Greaves M.D.   On: 01/19/2018 18:45    EKG: Independently reviewed. Sinus bradycardia 47 bpm  Assessment/Plan Failure to thrive/generalized weakness : Patient presents with worsening weakness and frequent falls due to poor overall p.o. intake related with dysphasia symptoms.  Family reports being unable to care for her due to progressive decline - Admit to a telemetry bed - Social work consult for need of placement  Acute kidney injury superimposed on chronic kidney disease 2/2 Dehydration, : Patient presents with a creatinine of 1.83 with BUN 52 to suggest prerenal cause of symptoms.  Patient received at least 1.5 L of normal saline IV fluid in the ED. Patient appears to have been given 2 L normal saline IV fluids in the ED. - Monitor intake and out - Hold additional IV fluids at this time to monitor as patient with history of diastolic CHF and do not want to overload patient - Hold nephrotoxic agents  Dysphagia,  esophageal stricture: Patient previously noted to have esophageal stricture for which she declined esophageal stretching due to pain.   - Aspiration precautions - Elevate head of bed - Liquid diet as tolerated  Abnormal UA: Patient was found to have negative leukocytes positive nitrites, many bacteria, negative squamous epithelium cells, and negative WBCs.  During last hospitalization thought to have asymptomatic bacteremia. - Follow-up urine culture - Continue Rocephin for now  Hypokalemia: Acute.  Patient presents with potassium 3.2 on admission. - Give 30 mEq of potassium chloride IV - Continue to monitor and replace as needed  Bradycardia: On admission patient's heart rates noted to be in lower 40's on admission.  - Follow-up telemetry overnight  CAD history - Continue Plavix  Orthostatic hypotension, history of essential hypertension - Hold hydrochlorothiazide, amlodipine,  and Micardis at this time  Diastolic CHF, cardiomyopathy- Strict intake and output - Daily weights  Anemia  chronic disease: Hemoglobin appears to be near patient's baseline of around 11. - Continue to monitor  Hypothyroidism - Continue levothyroxine  Falls: Patient likely having falls related to Dehydration and overall weakness.  Family reports patient needs assistance with all ADLs at this time.   - Consider physical therapy once patient feeling better   DVT prophylaxis: Lovenox     Code Status: Full code Family Communication: Discussed patient care with son over the phone. Disposition Plan: Skilled nursing facility Consults called: None Admission status: Inpatient  Clydie Braun MD [facility] Pager [phone]   If 7PM-7AM, please contact night-coverage [email or web address] Password [facility]  01/19/2018, 10:46 PM

## 2018-01-19 NOTE — ED Notes (Signed)
[phone] daughter--[name]

## 2018-01-19 NOTE — ED Provider Notes (Addendum)
[facility]-EMERGENCY DEPT Provider Note   CSN: [id number] Arrival date & time: 01/19/18  1616     History   Chief Complaint Chief Complaint  Patient presents with  . Weakness    HPI Jennifer Pittman is a 84 y.o. female.  Pt presents to the ED today with weakness.  She lives at home with family.  She has had multiple falls and has had decreased appetite.  She has a hx of difficulty swallowing and has an esophageal stricture.  The patient has not wanted to get an esophageal stretching. The pt was orthostatic with EMS.  500 cc NS given en route.  Pt denies any pain.  She said she just wants to sleep.  The family said she's gone downhill since her admission in January.  She has gotten progressively weaker.  She is no longer able to walk and family is unable to care for her.  Speech therapy did see her on 09/27/17  Assessment / Plan / Recommendation Clinical Impression  Pt presents with s/s consistent with an esophageal dysphagia, marked by reports of globus, multiple swallows required with each food bolus.  No coughing during exam, no oral-motor deficits.  Pt describes swallowing certain foods to "push down" other food items.  Discussed swallow with pt, son and dtr-in-law.  An MBS or barium swallow would help deliniate source of deficits, but pt would not want to pursue dilatation or other forms of treatment should they be recommended.  Pt/family declined further intervention, including swallow studies. Discussed with family the potential for aspiration and its consequences.  We discussed basic strategies (HOB elevation at night and not eating two hours before bedtime; softer foods). Continue current diet.   Speech/language assessment ordered.  Pt is at baseline per screen.   No further SLP f/u is warranted - our services will sign off.  SLP Visit Diagnosis: Dysphagia, unspecified (R13.10)          Past Medical History:  Diagnosis Date  . B12 deficiency  10/26/2015  . Bundle branch block, left   . CKD (chronic kidney disease), stage IV (HCC) 10/26/2015  . Hypertension   . Hypothyroidism 10/26/2015  . Osteoarthritis   . Pernicious anemia   . Stroke (HCC)   . Thyroid disease     Patient Active Problem List   Diagnosis Date Noted  . Protein-calorie malnutrition, severe 01/21/2018  . Failure to thrive in adult 01/20/2018  . Dehydration 01/20/2018  . Acute kidney injury superimposed on chronic kidney disease (HCC) 01/20/2018  . Dysphasia 01/20/2018  . Dysphagia 01/20/2018  . Abnormal urinalysis 01/20/2018  . Hypokalemia 01/20/2018  . Bradycardia 01/20/2018  . Weakness of right leg 09/26/2017  . Right leg weakness 09/26/2017  . Acute CVA (cerebrovascular accident) (HCC) 10/27/2015  . History of CVA (cerebrovascular accident) 10/26/2015  . Facial droop 10/26/2015  . HTN (hypertension) 10/26/2015  . BBB (bundle branch block) 10/26/2015  . Pernicious anemia 10/26/2015  . Osteoarthritis 10/26/2015  . E. coli UTI: 10/19/2015 at PCP office 10/26/2015  . B12 deficiency 10/26/2015  . Hypothyroidism 10/26/2015  . Hyperlipidemia 10/26/2015  . CKD (chronic kidney disease), stage IV (HCC) 10/26/2015  . Elevated troponin   . Slurred speech     Past Surgical History:  Procedure Laterality Date  . ABDOMINAL HYSTERECTOMY    . hyterectomy    . TOTAL HIP ARTHROPLASTY Left      OB History   None      Home Medications    Prior  to Admission medications   Medication Sig Start Date End Date Taking? Authorizing Provider  Cyanocobalamin (B-12 IJ) Inject 1,000 mcg as directed every 30 (thirty) days.   Yes [provider]  dorzolamide-timolol (COSOPT) 22.3-6.8 MG/ML ophthalmic solution Place 1 drop into both eyes 2 (two) times daily. 09/06/15  Yes [provider]  amLODipine (NORVASC) 10 MG tablet Take 10 mg by mouth daily.    [provider]  atorvastatin (LIPITOR) 10 MG tablet Take 1 tablet (10 mg total) by mouth daily  at 6 PM. 10/29/15   Jennifer Pia, MD  Cholecalciferol (VITAMIN D) 2000 units tablet Take 2,000 Units by mouth daily.    [provider]  clopidogrel (PLAVIX) 75 MG tablet Take 1 tablet (75 mg total) by mouth daily. 03/06/16   Nilda Riggs, NP  latanoprost (XALATAN) 0.005 % ophthalmic solution Place 1 drop into both eyes at bedtime. 09/07/15   [provider]  levothyroxine (SYNTHROID, LEVOTHROID) 112 MCG tablet Take 112 mcg by mouth daily before breakfast. 09/06/15   [provider]  Multiple Vitamin (MULTIVITAMIN WITH MINERALS) TABS tablet Take 1 tablet by mouth daily.    [provider]    Family History History reviewed. No pertinent family history.  Social History Social History   Tobacco Use  . Smoking status: Never Smoker  . Smokeless tobacco: Never Used  Substance Use Topics  . Alcohol use: No  . Drug use: No     Allergies   Patient has no known allergies.   Review of Systems Review of Systems  Constitutional: Positive for appetite change.  Neurological: Positive for weakness.  All other systems reviewed and are negative.    Physical Exam Updated Vital Signs BP (!) 145/62 (BP Location: Left Arm)   Pulse 61   Temp 97.6 F (36.4 C) (Oral)   Resp 15   Ht 5\' 4"  (1.626 m)   Wt 46.1 kg (101 lb 10.1 oz)   SpO2 97%   BMI 17.45 kg/m   Physical Exam  Constitutional: She is oriented to person, place, and time. She appears well-developed and well-nourished.  HENT:  Head: Normocephalic and atraumatic.  Right Ear: External ear normal.  Left Ear: External ear normal.  Nose: Nose normal.  Mouth/Throat: Mucous membranes are dry.  Eyes: Pupils are equal, round, and reactive to light. Conjunctivae and EOM are normal.  Neck: Normal range of motion. Neck supple.  Cardiovascular: Regular rhythm, normal heart sounds and intact distal pulses. Bradycardia present.  Pulmonary/Chest: Effort normal and breath sounds normal.  Abdominal:  Soft. Bowel sounds are normal.  Musculoskeletal: Normal range of motion.  Neurological: She is alert and oriented to person, place, and time.  Skin: Skin is warm. Capillary refill takes less than 2 seconds.  Psychiatric: She has a normal mood and affect. Her behavior is normal. Judgment and thought content normal.  Nursing note and vitals reviewed.    ED Treatments / Results  Labs (all labs ordered are listed, but only abnormal results are displayed) Labs Reviewed  URINE CULTURE - Abnormal; Notable for the following components:      Result Value   Culture >=100,000 COLONIES/mL ESCHERICHIA COLI (*)    Organism ID, Bacteria ESCHERICHIA COLI (*)    All other components within normal limits  BASIC METABOLIC PANEL - Abnormal; Notable for the following components:   Potassium 3.2 (*)    Chloride 95 (*)    Glucose, Bld 127 (*)    BUN 52 (*)  Creatinine, Ser 1.83 (*)    Calcium 8.4 (*)    GFR calc non Af Amer 22 (*)    GFR calc Af Amer 25 (*)    All other components within normal limits  CBC - Abnormal; Notable for the following components:   RBC 3.55 (*)    Hemoglobin 11.1 (*)    HCT 33.8 (*)    All other components within normal limits  URINALYSIS, ROUTINE W REFLEX MICROSCOPIC - Abnormal; Notable for the following components:   APPearance HAZY (*)    Hgb urine dipstick SMALL (*)    Ketones, ur 5 (*)    Protein, ur 30 (*)    Nitrite POSITIVE (*)    Bacteria, UA MANY (*)    All other components within normal limits  TROPONIN I - Abnormal; Notable for the following components:   Troponin I 0.46 (*)    All other components within normal limits  CBC - Abnormal; Notable for the following components:   RBC 3.40 (*)    Hemoglobin 10.5 (*)    HCT 32.2 (*)    All other components within normal limits  MAGNESIUM - Abnormal; Notable for the following components:   Magnesium 1.3 (*)    All other components within normal limits  COMPREHENSIVE METABOLIC PANEL - Abnormal; Notable for  the following components:   Potassium 3.1 (*)    BUN 46 (*)    Creatinine, Ser 1.57 (*)    Calcium 7.8 (*)    Total Protein 5.3 (*)    Albumin 2.9 (*)    ALT 10 (*)    GFR calc non Af Amer 26 (*)    GFR calc Af Amer 30 (*)    All other components within normal limits  PREALBUMIN - Abnormal; Notable for the following components:   Prealbumin 15.6 (*)    All other components within normal limits  TROPONIN I - Abnormal; Notable for the following components:   Troponin I 0.30 (*)    All other components within normal limits  TROPONIN I - Abnormal; Notable for the following components:   Troponin I 0.28 (*)    All other components within normal limits  TROPONIN I - Abnormal; Notable for the following components:   Troponin I 0.26 (*)    All other components within normal limits  CBC - Abnormal; Notable for the following components:   WBC 14.4 (*)    RBC 3.69 (*)    Hemoglobin 11.4 (*)    HCT 35.1 (*)    All other components within normal limits  BASIC METABOLIC PANEL - Abnormal; Notable for the following components:   BUN 36 (*)    Creatinine, Ser 1.32 (*)    Calcium 7.7 (*)    GFR calc non Af Amer 32 (*)    GFR calc Af Amer 38 (*)    All other components within normal limits  CBC WITH DIFFERENTIAL/PLATELET - Abnormal; Notable for the following components:   WBC 13.1 (*)    RBC 3.51 (*)    Hemoglobin 10.8 (*)    HCT 33.4 (*)    Neutro Abs 10.1 (*)    All other components within normal limits  RENAL FUNCTION PANEL - Abnormal; Notable for the following components:   Sodium 133 (*)    CO2 21 (*)    BUN 34 (*)    Creatinine, Ser 1.31 (*)    Calcium 7.8 (*)    Phosphorus 1.7 (*)    Albumin 2.5 (*)  GFR calc non Af Amer 33 (*)    GFR calc Af Amer 38 (*)    All other components within normal limits  RENAL FUNCTION PANEL - Abnormal; Notable for the following components:   BUN 31 (*)    Creatinine, Ser 1.20 (*)    Calcium 7.7 (*)    Phosphorus 1.9 (*)    Albumin 2.4 (*)     GFR calc non Af Amer 36 (*)    GFR calc Af Amer 42 (*)    All other components within normal limits  CBC WITH DIFFERENTIAL/PLATELET - Abnormal; Notable for the following components:   RBC 3.46 (*)    Hemoglobin 10.7 (*)    HCT 33.0 (*)    All other components within normal limits  MAGNESIUM    EKG EKG Interpretation  Date/Time:  Sunday Jan 19 2018 16:43:20 EDT Ventricular Rate:  47 PR Interval:    QRS Duration: 157 QT Interval:  499 QTC Calculation: 442 R Axis:   -30 Text Interpretation:  Sinus bradycardia Left bundle branch block Since last tracing rate slower Confirmed by Jacalyn Lefevre [phone]) on 01/19/2018 5:46:20 PM   Radiology No results found.  Procedures Procedures (including critical care time)  Medications Ordered in ED Medications  sodium chloride 0.9 % bolus 1,000 mL (0 mLs Intravenous Stopped 01/19/18 1942)  0.9 %  sodium chloride infusion ( Intravenous Transfusing/Transfer 01/19/18 2344)  cefTRIAXone (ROCEPHIN) 1 g in sodium chloride 0.9 % 100 mL IVPB (0 g Intravenous Stopped 01/19/18 2322)  potassium chloride 10 mEq in 100 mL IVPB (0 mEq Intravenous Stopped 01/20/18 0425)  potassium chloride 10 mEq in 100 mL IVPB (0 mEq Intravenous Stopped 01/20/18 1328)  magnesium sulfate IVPB 2 g 50 mL (0 g Intravenous Stopped 01/20/18 0901)     Initial Impression / Assessment and Plan / ED Course  I have reviewed the triage vital signs and the nursing notes.  Pertinent labs & imaging results that were available during my care of the patient were reviewed by me and considered in my medical decision making (see chart for details).  CRITICAL CARE Performed by: Jacalyn Lefevre   Total critical care time: 30 minutes  Critical care time was exclusive of separately billable procedures and treating other patients.  Critical care was necessary to treat or prevent imminent or life-threatening deterioration.  Critical care was time spent personally by me on the following  activities: development of treatment plan with patient and/or surrogate as well as nursing, discussions with consultants, evaluation of patient's response to treatment, examination of patient, obtaining history from patient or surrogate, ordering and performing treatments and interventions, ordering and review of laboratory studies, ordering and review of radiographic studies, pulse oximetry and re-evaluation of patient's condition.    GFR has gone from 28 to 22 since 3 months ago.  Cr has gone from 1.48 to 1.83.  Pt given IVFs.  She may have an uti and culture was sent.  Pt may need a palliative care consult.  Pt d/w Dr. Katrinka Blazing ([facility]) for admission.  I left a message with daughter, Marylu Lund.  Final Clinical Impressions(s) / ED Diagnoses   Final diagnoses:  Bradycardia  Failure to thrive in adult  Orthostatic hypotension  Esophageal dysphagia  Acute renal failure superimposed on stage 4 chronic kidney disease, unspecified acute renal failure type (HCC)  Weakness  Dehydration  Acute cystitis without hematuria    ED Discharge Orders        Ordered    cephALEXin (KEFLEX)  250 MG capsule  Every 12 hours     01/23/18 1342    Increase activity slowly     01/23/18 1342    Diet - low sodium heart healthy     01/23/18 1342       Jacalyn Lefevre, MD 01/19/18 1610    Jacalyn Lefevre, MD 01/29/18 1650

## 2018-01-20 ENCOUNTER — Other Ambulatory Visit (HOSPITAL_COMMUNITY): Payer: Medicare Other

## 2018-01-20 DIAGNOSIS — R4702 Dysphasia: Secondary | ICD-10-CM | POA: Diagnosis present

## 2018-01-20 DIAGNOSIS — K222 Esophageal obstruction: Secondary | ICD-10-CM | POA: Diagnosis present

## 2018-01-20 DIAGNOSIS — N179 Acute kidney failure, unspecified: Secondary | ICD-10-CM | POA: Diagnosis present

## 2018-01-20 DIAGNOSIS — Z743 Need for continuous supervision: Secondary | ICD-10-CM | POA: Diagnosis not present

## 2018-01-20 DIAGNOSIS — R1310 Dysphagia, unspecified: Secondary | ICD-10-CM

## 2018-01-20 DIAGNOSIS — B9620 Unspecified Escherichia coli [E. coli] as the cause of diseases classified elsewhere: Secondary | ICD-10-CM | POA: Diagnosis present

## 2018-01-20 DIAGNOSIS — I361 Nonrheumatic tricuspid (valve) insufficiency: Secondary | ICD-10-CM | POA: Diagnosis not present

## 2018-01-20 DIAGNOSIS — Z681 Body mass index (BMI) 19 or less, adult: Secondary | ICD-10-CM | POA: Diagnosis not present

## 2018-01-20 DIAGNOSIS — E876 Hypokalemia: Secondary | ICD-10-CM | POA: Diagnosis present

## 2018-01-20 DIAGNOSIS — Z5329 Procedure and treatment not carried out because of patient's decision for other reasons: Secondary | ICD-10-CM | POA: Diagnosis present

## 2018-01-20 DIAGNOSIS — I130 Hypertensive heart and chronic kidney disease with heart failure and stage 1 through stage 4 chronic kidney disease, or unspecified chronic kidney disease: Secondary | ICD-10-CM | POA: Diagnosis present

## 2018-01-20 DIAGNOSIS — Z8673 Personal history of transient ischemic attack (TIA), and cerebral infarction without residual deficits: Secondary | ICD-10-CM | POA: Diagnosis not present

## 2018-01-20 DIAGNOSIS — I2510 Atherosclerotic heart disease of native coronary artery without angina pectoris: Secondary | ICD-10-CM | POA: Diagnosis present

## 2018-01-20 DIAGNOSIS — R278 Other lack of coordination: Secondary | ICD-10-CM | POA: Diagnosis not present

## 2018-01-20 DIAGNOSIS — R001 Bradycardia, unspecified: Secondary | ICD-10-CM | POA: Diagnosis present

## 2018-01-20 DIAGNOSIS — N3000 Acute cystitis without hematuria: Secondary | ICD-10-CM | POA: Diagnosis present

## 2018-01-20 DIAGNOSIS — R627 Adult failure to thrive: Secondary | ICD-10-CM | POA: Diagnosis not present

## 2018-01-20 DIAGNOSIS — N189 Chronic kidney disease, unspecified: Secondary | ICD-10-CM | POA: Diagnosis not present

## 2018-01-20 DIAGNOSIS — I5042 Chronic combined systolic (congestive) and diastolic (congestive) heart failure: Secondary | ICD-10-CM | POA: Diagnosis present

## 2018-01-20 DIAGNOSIS — E860 Dehydration: Secondary | ICD-10-CM | POA: Diagnosis present

## 2018-01-20 DIAGNOSIS — R279 Unspecified lack of coordination: Secondary | ICD-10-CM | POA: Diagnosis not present

## 2018-01-20 DIAGNOSIS — R1314 Dysphagia, pharyngoesophageal phase: Secondary | ICD-10-CM | POA: Diagnosis present

## 2018-01-20 DIAGNOSIS — R8290 Unspecified abnormal findings in urine: Secondary | ICD-10-CM | POA: Diagnosis present

## 2018-01-20 DIAGNOSIS — E43 Unspecified severe protein-calorie malnutrition: Secondary | ICD-10-CM | POA: Diagnosis not present

## 2018-01-20 DIAGNOSIS — I214 Non-ST elevation (NSTEMI) myocardial infarction: Secondary | ICD-10-CM | POA: Diagnosis present

## 2018-01-20 DIAGNOSIS — D631 Anemia in chronic kidney disease: Secondary | ICD-10-CM | POA: Diagnosis present

## 2018-01-20 DIAGNOSIS — I951 Orthostatic hypotension: Secondary | ICD-10-CM | POA: Diagnosis present

## 2018-01-20 DIAGNOSIS — N390 Urinary tract infection, site not specified: Secondary | ICD-10-CM | POA: Diagnosis not present

## 2018-01-20 DIAGNOSIS — R41841 Cognitive communication deficit: Secondary | ICD-10-CM | POA: Diagnosis not present

## 2018-01-20 DIAGNOSIS — M6281 Muscle weakness (generalized): Secondary | ICD-10-CM | POA: Diagnosis not present

## 2018-01-20 DIAGNOSIS — R2681 Unsteadiness on feet: Secondary | ICD-10-CM | POA: Diagnosis not present

## 2018-01-20 DIAGNOSIS — N184 Chronic kidney disease, stage 4 (severe): Secondary | ICD-10-CM | POA: Diagnosis present

## 2018-01-20 DIAGNOSIS — E039 Hypothyroidism, unspecified: Secondary | ICD-10-CM | POA: Diagnosis not present

## 2018-01-20 DIAGNOSIS — I429 Cardiomyopathy, unspecified: Secondary | ICD-10-CM | POA: Diagnosis present

## 2018-01-20 DIAGNOSIS — R296 Repeated falls: Secondary | ICD-10-CM | POA: Diagnosis present

## 2018-01-20 LAB — COMPREHENSIVE METABOLIC PANEL
ALK PHOS: 47 U/L (ref 38–126)
ALT: 10 U/L — AB (ref 14–54)
ANION GAP: 9 (ref 5–15)
AST: 16 U/L (ref 15–41)
Albumin: 2.9 g/dL — ABNORMAL LOW (ref 3.5–5.0)
BILIRUBIN TOTAL: 0.5 mg/dL (ref 0.3–1.2)
BUN: 46 mg/dL — ABNORMAL HIGH (ref 6–20)
CALCIUM: 7.8 mg/dL — AB (ref 8.9–10.3)
CO2: 27 mmol/L (ref 22–32)
CREATININE: 1.57 mg/dL — AB (ref 0.44–1.00)
Chloride: 102 mmol/L (ref 101–111)
GFR calc Af Amer: 30 mL/min — ABNORMAL LOW (ref >60)
GFR calc non Af Amer: 26 mL/min — ABNORMAL LOW (ref >60)
Glucose, Bld: 78 mg/dL (ref 65–99)
Potassium: 3.1 mmol/L — ABNORMAL LOW (ref 3.5–5.1)
SODIUM: 138 mmol/L (ref 135–145)
TOTAL PROTEIN: 5.3 g/dL — AB (ref 6.5–8.1)

## 2018-01-20 LAB — CBC
HEMATOCRIT: 32.2 % — AB (ref 36.0–46.0)
HEMOGLOBIN: 10.5 g/dL — AB (ref 12.0–15.0)
MCH: 30.9 pg (ref 26.0–34.0)
MCHC: 32.6 g/dL (ref 30.0–36.0)
MCV: 94.7 fL (ref 78.0–100.0)
Platelets: 160 10*3/uL (ref 150–400)
RBC: 3.4 MIL/uL — ABNORMAL LOW (ref 3.87–5.11)
RDW: 13.5 % (ref 11.5–15.5)
WBC: 7.8 10*3/uL (ref 4.0–10.5)

## 2018-01-20 LAB — PREALBUMIN: Prealbumin: 15.6 mg/dL — ABNORMAL LOW (ref 18–38)

## 2018-01-20 LAB — TROPONIN I
TROPONIN I: 0.46 ng/mL — AB (ref <0.03)
TROPONIN I: 0.28 ng/mL — AB (ref <0.03)
Troponin I: 0.3 ng/mL (ref <0.03)
Troponin I: 0.26 ng/mL (ref <0.03)

## 2018-01-20 LAB — MAGNESIUM: Magnesium: 1.3 mg/dL — ABNORMAL LOW (ref 1.7–2.4)

## 2018-01-20 MED ORDER — SODIUM CHLORIDE 0.9 % IV SOLN
1.0000 g | INTRAVENOUS | Status: DC
  Administered 2018-01-20 – 2018-01-21 (×2): 1 g via INTRAVENOUS
  Filled 2018-01-20 (×2): qty 1

## 2018-01-20 MED ORDER — ALBUTEROL SULFATE (2.5 MG/3ML) 0.083% IN NEBU: 2.5000 mg | INHALATION_SOLUTION | RESPIRATORY_TRACT | Status: DC | PRN

## 2018-01-20 MED ORDER — ENOXAPARIN SODIUM 30 MG/0.3ML ~~LOC~~ SOLN
30.0000 mg | Freq: Every day | SUBCUTANEOUS | Status: DC
  Administered 2018-01-20 – 2018-01-22 (×4): 30 mg via SUBCUTANEOUS
  Filled 2018-01-20 (×5): qty 0.3

## 2018-01-20 MED ORDER — ATORVASTATIN CALCIUM 10 MG PO TABS
10.0000 mg | ORAL_TABLET | Freq: Every day | ORAL | Status: DC
  Administered 2018-01-20 – 2018-01-22 (×3): 10 mg via ORAL
  Filled 2018-01-20 (×3): qty 1

## 2018-01-20 MED ORDER — ACETAMINOPHEN 650 MG RE SUPP: 650.0000 mg | Freq: Four times a day (QID) | RECTAL | Status: DC | PRN

## 2018-01-20 MED ORDER — POTASSIUM CHLORIDE 10 MEQ/100ML IV SOLN
10.0000 meq | INTRAVENOUS | Status: AC
  Administered 2018-01-20 (×3): 10 meq via INTRAVENOUS
  Filled 2018-01-20 (×3): qty 100

## 2018-01-20 MED ORDER — ONDANSETRON HCL 4 MG/2ML IJ SOLN: 4.0000 mg | Freq: Four times a day (QID) | INTRAMUSCULAR | Status: DC | PRN

## 2018-01-20 MED ORDER — LATANOPROST 0.005 % OP SOLN
1.0000 [drp] | Freq: Every day | OPHTHALMIC | Status: DC
  Administered 2018-01-20 – 2018-01-22 (×3): 1 [drp] via OPHTHALMIC
  Filled 2018-01-20: qty 2.5

## 2018-01-20 MED ORDER — ONDANSETRON HCL 4 MG PO TABS: 4.0000 mg | ORAL_TABLET | Freq: Four times a day (QID) | ORAL | Status: DC | PRN

## 2018-01-20 MED ORDER — LEVOTHYROXINE SODIUM 112 MCG PO TABS
112.0000 ug | ORAL_TABLET | Freq: Every day | ORAL | Status: DC
  Administered 2018-01-20 – 2018-01-23 (×4): 112 ug via ORAL
  Filled 2018-01-20 (×4): qty 1

## 2018-01-20 MED ORDER — CLOPIDOGREL BISULFATE 75 MG PO TABS
75.0000 mg | ORAL_TABLET | Freq: Every day | ORAL | Status: DC
  Administered 2018-01-20 – 2018-01-23 (×4): 75 mg via ORAL
  Filled 2018-01-20 (×4): qty 1

## 2018-01-20 MED ORDER — MAGNESIUM SULFATE 2 GM/50ML IV SOLN
2.0000 g | Freq: Once | INTRAVENOUS | Status: AC
Start: 2018-01-20 — End: 2018-01-20
  Administered 2018-01-20: 2 g via INTRAVENOUS
  Filled 2018-01-20: qty 50

## 2018-01-20 MED ORDER — ACETAMINOPHEN 325 MG PO TABS: 650.0000 mg | ORAL_TABLET | Freq: Four times a day (QID) | ORAL | Status: DC | PRN

## 2018-01-20 MED ORDER — DORZOLAMIDE HCL-TIMOLOL MAL 2-0.5 % OP SOLN
1.0000 [drp] | Freq: Two times a day (BID) | OPHTHALMIC | Status: DC
  Administered 2018-01-20 – 2018-01-23 (×7): 1 [drp] via OPHTHALMIC
  Filled 2018-01-20: qty 10

## 2018-01-20 NOTE — Progress Notes (Signed)
CRITICAL VALUE ALERT  Critical Value:  Troponin 0.46  Date & Time Notied:  01/20/18 0252  Provider Notified: Rana Snare NP   Orders Received/Actions taken: No new orders

## 2018-01-20 NOTE — Progress Notes (Signed)
[facility] Progress Note  Patient: Jennifer Pittman ZOX:[id number]   PCP: Georgianne Fick, MD DOB: [birth year]   DOA: 01/19/2018   DOS: 01/20/2018   Date of Service: the patient was seen and examined on 01/20/2018  Subjective: Feeling better, no chest pain no heaviness.  Some shortness of breath.  No abdominal pain no diarrhea.  No nausea no vomiting.  Tolerating oral diet.  Brief hospital course: Pt. with PMH of HTN, HLD, hypothyroidism, dysphagia with esophageal stricture, CVA,; admitted on 01/19/2018, presented with complaint of fall, was found to have non-STEMI, acute kidney injury from dehydration. Currently further plan is continue gentle hydration.  Assessment and Plan: Failure to thrive/generalized weakness : Patient presents with worsening weakness and frequent falls due to poor overall p.o. intake related with dysphasia symptoms.  Family reports being unable to care for her due to progressive decline Patient has 6 pound weight loss in the last few months. We will continue with liquid diet as the patient has not been able to tolerate it. Dietary consulted.  Acute kidney injury superimposed on chronic kidney disease stage III 2/2 Dehydration,  Function worsened further.  We will continue with IV hydration for now.  Dysphagia, esophageal stricture: Patient previously noted to have esophageal stricture for which she declined esophageal stretching due to pain.   - Aspiration precautions - Elevate head of bed - Liquid diet as tolerated  Abnormal UA: Patient was found to have negative leukocytes positive nitrites, many bacteria, negative squamous epithelium cells, and negative WBCs.  During last hospitalization thought to have asymptomatic bacteremia. - Follow-up urine culture - Continue Rocephin for now  Hypokalemia: Acute.  Patient presents with potassium 3.2 on admission. Recheck labs.  Bradycardia: On admission patient's heart rates noted to be in lower 40's on  admission.  - Follow-up telemetry overnight  CAD history - Continue Plavix  Orthostatic hypotension, history of essential hypertension - Hold hydrochlorothiazide, amlodipine,  and Micardis at this time  Diastolic CHF, cardiomyopathy- Strict intake and output - Daily weights  Anemia chronic disease: Hemoglobin appears to be near patient's baseline of around 11. - Continue to monitor  Hypothyroidism - Continue levothyroxine  Falls: Patient likely having falls related to Dehydration and overall weakness.  Family reports patient needs assistance with all ADLs at this time.   - Consider physical therapy once patient feeling better  Diet: Full liquid diet DVT Prophylaxis: subcutaneous Heparin  Advance goals of care discussion: DNR/DNI  Family Communication: family was present at bedside, at the time of interview. The pt provided permission to discuss medical plan with the family. Opportunity was given to ask question and all questions were answered satisfactorily.   Disposition:  Discharge to be determined.  Consultants: nonoe Procedures: none  Antibiotics: Anti-infectives (From admission, onward)   Start     Dose/Rate Route Frequency Ordered Stop   01/20/18 2200  cefTRIAXone (ROCEPHIN) 1 g in sodium chloride 0.9 % 100 mL IVPB     1 g 200 mL/hr over 30 Minutes Intravenous Every 24 hours 01/20/18 0128     01/19/18 2230  cefTRIAXone (ROCEPHIN) 1 g in sodium chloride 0.9 % 100 mL IVPB     1 g 200 mL/hr over 30 Minutes Intravenous  Once 01/19/18 2219 01/19/18 2322       Objective: Physical Exam: Vitals:   01/20/18 0201 01/20/18 0205 01/20/18 0455 01/20/18 1421  BP: 107/74  131/62 106/73  Pulse: (!) 44  (!) 42 (!) 50  Resp: Temp:  97.7 F (36.5 C)  (!) 97.4 F (36.3 C) (!) 97.5 F (36.4 C)  TempSrc:    Oral  SpO2: 98%  99% 91%  Weight:  43 kg (94 lb 12.8 oz)    Height:   (1.626 m)      Intake/Output Summary (Last 24 hours) at 01/20/2018  1624 Last data filed at 01/20/2018 1422 Gross per 24 hour  Intake 2140 ml  Output 225 ml  Net 1915 ml   Filed Weights   01/19/18 1644 01/20/18 0205  Weight: 42.6 kg (94 lb) 43 kg (94 lb 12.8 oz)   General: Alert, Awake and Oriented to Time, Place and Person. Appear in moderate distress, affect appropriate Eyes: PERRL, Conjunctiva normal ENT: Oral Mucosa clear moist. Neck: no JVD, no Abnormal Mass Or lumps Cardiovascular: S1 and S2 Present, no Murmur, Peripheral Pulses Present Respiratory: normal respiratory effort, Bilateral Air entry equal and Decreased, no use of accessory muscle, Clear to Auscultation, no Crackles, no wheezes Abdomen: Bowel Sound present, Soft and no tenderness, no hernia Skin: no redness, no Rash, no induration Extremities: no Pedal edema, no calf tenderness Neurologic: Grossly no focal neuro deficit. Bilaterally Equal motor strength  Data Reviewed: CBC: Recent Labs  Lab 01/19/18 1759 01/20/18 0211  WBC 9.5 7.8  HGB 11.1* 10.5*  HCT 33.8* 32.2*  MCV 95.2 94.7  PLT 169 160   Basic Metabolic Panel: Recent Labs  Lab 01/19/18 1759 01/20/18 0211  NA 137 138  K 3.2* 3.1*  CL 95* 102  CO2 30 27  GLUCOSE 127* 78  BUN 52* 46*  CREATININE 1.83* 1.57*  CALCIUM 8.4* 7.8*  MG  --  1.3*    Liver Function Tests: Recent Labs  Lab 01/20/18 0211  AST 16  ALT 10*  ALKPHOS 47  BILITOT 0.5  PROT 5.3*  ALBUMIN 2.9*   No results for input(s): LIPASE, AMYLASE in the last 168 hours. No results for input(s): AMMONIA in the last 168 hours. Coagulation Profile: No results for input(s): INR, PROTIME in the last 168 hours. Cardiac Enzymes: Recent Labs  Lab 01/20/18 0211 01/20/18 1142  TROPONINI 0.46* 0.30*   BNP (last 3 results) No results for input(s): PROBNP in the last 8760 hours. CBG: No results for input(s): GLUCAP in the last 168 hours. Studies: Dg Chest 2 View  Result Date: 01/19/2018 CLINICAL DATA:  Weakness EXAM: CHEST - 2 VIEW COMPARISON:   10/26/2015 FINDINGS: Mild hyperinflation. Numerous leads and wires project over the chest. Midline trachea. Mild cardiomegaly. Atherosclerosis in the transverse aorta. Tortuous thoracic aorta. No pleural effusion or pneumothorax. No congestive failure. Clear lungs. IMPRESSION: No acute cardiopulmonary disease. Cardiomegaly without congestive failure. Aortic Atherosclerosis (ICD10-I70.0). Hyperinflation. Electronically Signed   By: Jeronimo Greaves M.D.   On: 01/19/2018 18:45    Scheduled Meds: . atorvastatin  10 mg Oral q1800  . clopidogrel  75 mg Oral Daily  . dorzolamide-timolol  1 drop Both Eyes BID  . enoxaparin (LOVENOX) injection  30 mg Subcutaneous QHS  . latanoprost  1 drop Both Eyes QHS  . levothyroxine  112 mcg Oral QAC breakfast   Continuous Infusions: . cefTRIAXone (ROCEPHIN)  IV     PRN Meds: acetaminophen **OR** acetaminophen, albuterol, ondansetron **OR** ondansetron (ZOFRAN) IV  Time spent: 35 minutes  Author: Lynden Oxford, MD [facility] Pager: [phone] 01/20/2018 4:24 PM  If 7PM-7AM, please contact night-coverage at [email or web address], password [facility]

## 2018-01-20 NOTE — ED Notes (Signed)
ED TO INPATIENT HANDOFF REPORT  Name/Age/Gender Jennifer Pittman 84 y.o. female  Code Status    Code Status Orders  (From admission, onward)        Start     Ordered   01/20/18 0005  Do not attempt resuscitation (DNR)  Continuous    Question Answer Comment  In the event of cardiac or respiratory ARREST Do not call a "code blue"   In the event of cardiac or respiratory ARREST Do not perform Intubation, CPR, defibrillation or ACLS   In the event of cardiac or respiratory ARREST Use medication by any route, position, wound care, and other measures to relive pain and suffering. May use oxygen, suction and manual treatment of airway obstruction as needed for comfort.      01/20/18 0007    Code Status History    Date Active Date Inactive Code Status Order ID Comments User Context   09/26/2017 0244 09/27/2017 2055 DNR [id number]  Vianne Bulls, MD ED   09/26/2017 0242 09/26/2017 0244 Full Code [id number]  Vianne Bulls, MD ED   10/26/2015 1851 10/29/2015 2020 DNR [id number]  Eugenie Filler, MD Inpatient      Home/SNF/Other Home  Chief Complaint FTT  Level of Care/Admitting Diagnosis ED Disposition    ED Disposition Condition [facility] Area: [facility] [[id number]]  Level of Care: Telemetry [5]  Admit to tele based on following criteria: Complex arrhythmia (Bradycardia/Tachycardia)  Diagnosis: Failure to thrive in adult [358490]  Admitting Physician: Norval Morton [[id number]]  Attending Physician: Norval Morton [[id number]]  Estimated length of stay: past midnight tomorrow  Certification:: I certify this patient will need inpatient services for at least 2 midnights  PT Class (Do Not Modify): Inpatient [101]  PT Acc Code (Do Not Modify): Private [1]       Medical History Past Medical History:  Diagnosis Date  . B12 deficiency 10/26/2015  . Bundle branch block, left   . CKD (chronic kidney disease), stage IV ([place]) 10/26/2015  .  Hypertension   . Hypothyroidism 10/26/2015  . Osteoarthritis   . Pernicious anemia   . Stroke ([place])   . Thyroid disease     Allergies No Known Allergies  IV Location/Drains/Wounds Patient Lines/Drains/Airways Status   Active Line/Drains/Airways    Name:   Placement date:   Placement time:   Site:   Days:   Peripheral IV 01/19/18 Left Antecubital   01/19/18    1631    Antecubital   1   External Urinary Catheter   09/26/17    0318    -   116          Labs/Imaging Results for orders placed or performed during the hospital encounter of 01/19/18 (from the past 48 hour(s))  Basic metabolic panel     Status: Abnormal   Collection Time: 01/19/18  5:59 PM  Result Value Ref Range   Sodium 137 135 - 145 mmol/L   Potassium 3.2 (L) 3.5 - 5.1 mmol/L   Chloride 95 (L) 101 - 111 mmol/L   CO2 30 22 - 32 mmol/L   Glucose, Bld 127 (H) 65 - 99 mg/dL   BUN 52 (H) 6 - 20 mg/dL   Creatinine, Ser 1.83 (H) 0.44 - 1.00 mg/dL   Calcium 8.4 (L) 8.9 - 10.3 mg/dL   GFR calc non Af Amer 22 (L) >60 mL/min   GFR calc Af Amer 25 (L) >60 mL/min  Comment: (NOTE) The eGFR has been calculated using the CKD EPI equation. This calculation has not been validated in all clinical situations. eGFR's persistently <60 mL/min signify possible Chronic Kidney Disease.    Anion gap 12 5 - 15    Comment: Performed at [facility], [place] [address], [place], [place] [address]  CBC     Status: Abnormal   Collection Time: 01/19/18  5:59 PM  Result Value Ref Range   WBC 9.5 4.0 - 10.5 K/uL   RBC 3.55 (L) 3.87 - 5.11 MIL/uL   Hemoglobin 11.1 (L) 12.0 - 15.0 g/dL   HCT 33.8 (L) 36.0 - 46.0 %   MCV 95.2 78.0 - 100.0 fL   MCH 31.3 26.0 - 34.0 pg   MCHC 32.8 30.0 - 36.0 g/dL   RDW 13.6 11.5 - 15.5 %   Platelets 169 150 - 400 K/uL    Comment: Performed at [facility], [place] [address] [place], [place] [address]  Urinalysis, Routine w reflex microscopic     Status: Abnormal    Collection Time: 01/19/18  9:29 PM  Result Value Ref Range   Color, Urine YELLOW YELLOW   APPearance HAZY (A) CLEAR   Specific Gravity, Urine 1.017 1.005 - 1.030   pH 6.0 5.0 - 8.0   Glucose, UA NEGATIVE NEGATIVE mg/dL   Hgb urine dipstick SMALL (A) NEGATIVE   Bilirubin Urine NEGATIVE NEGATIVE   Ketones, ur 5 (A) NEGATIVE mg/dL   Protein, ur 30 (A) NEGATIVE mg/dL   Nitrite POSITIVE (A) NEGATIVE   Leukocytes, UA NEGATIVE NEGATIVE   RBC / HPF 0-5 0 - 5 RBC/hpf   WBC, UA 0-5 0 - 5 WBC/hpf   Bacteria, UA MANY (A) NONE SEEN   Squamous Epithelial / LPF 0-5 0 - 5   Mucus PRESENT     Comment: Performed at [facility], [place] [address], [place], [place] [address]   Dg Chest 2 View  Result Date: 01/19/2018 CLINICAL DATA:  Weakness EXAM: CHEST - 2 VIEW COMPARISON:  10/26/2015 FINDINGS: Mild hyperinflation. Numerous leads and wires project over the chest. Midline trachea. Mild cardiomegaly. Atherosclerosis in the transverse aorta. Tortuous thoracic aorta. No pleural effusion or pneumothorax. No congestive failure. Clear lungs. IMPRESSION: No acute cardiopulmonary disease. Cardiomegaly without congestive failure. Aortic Atherosclerosis (ICD10-I70.0). Hyperinflation. Electronically Signed   By: Abigail Miyamoto M.D.   On: 01/19/2018 18:45    Pending Labs Unresulted Labs (From admission, onward)   Start     Ordered   01/20/18 0500  CBC  Tomorrow morning,   R     01/20/18 0007   01/20/18 0500  Magnesium  Tomorrow morning,   R     01/20/18 0007   01/20/18 0500  Comprehensive metabolic panel  Tomorrow morning,   R     01/20/18 0007   01/20/18 0500  Prealbumin  Tomorrow morning,   R     01/20/18 0101   01/19/18 2322  Troponin I  Add-on,   R     01/19/18 2321   01/19/18 2221  Urine culture  STAT,   STAT     01/19/18 2220      Vitals/Pain Today's Vitals   01/19/18 2030 01/19/18 2130 01/19/18 2250 01/19/18 2335  BP: 132/63 (!) 150/62 138/63   Pulse: (!) 42 (!) 44 (!) 57    Resp: 16 12 14    Temp:      TempSrc:      SpO2: 100% 100% 100%  Weight:      PainSc:    Asleep    Isolation Precautions No active isolations  Medications Medications  levothyroxine (SYNTHROID, LEVOTHROID) tablet 112 mcg (has no administration in time range)  latanoprost (XALATAN) 0.005 % ophthalmic solution 1 drop (has no administration in time range)  dorzolamide-timolol (COSOPT) 22.3-6.8 MG/ML ophthalmic solution 1 drop (has no administration in time range)  clopidogrel (PLAVIX) tablet 75 mg (has no administration in time range)  atorvastatin (LIPITOR) tablet 10 mg (has no administration in time range)  enoxaparin (LOVENOX) injection 30 mg (has no administration in time range)  ondansetron (ZOFRAN) tablet 4 mg (has no administration in time range)    Or  ondansetron (ZOFRAN) injection 4 mg (has no administration in time range)  acetaminophen (TYLENOL) tablet 650 mg (has no administration in time range)    Or  acetaminophen (TYLENOL) suppository 650 mg (has no administration in time range)  albuterol (PROVENTIL) (2.5 MG/3ML) 0.083% nebulizer solution 2.5 mg (has no administration in time range)  potassium chloride 10 mEq in 100 mL IVPB (has no administration in time range)  sodium chloride 0.9 % bolus 1,000 mL (0 mLs Intravenous Stopped 01/19/18 1942)  0.9 %  sodium chloride infusion ( Intravenous Transfusing/Transfer 01/19/18 2344)  cefTRIAXone (ROCEPHIN) 1 g in sodium chloride 0.9 % 100 mL IVPB (0 g Intravenous Stopped 01/19/18 2322)    Mobility ED TO INPATIENT HANDOFF REPORT  Name/Age/Gender Jennifer Pittman 84 y.o. female  Code Status    Code Status Orders  (From admission, onward)        Start     Ordered   01/20/18 0005  Do not attempt resuscitation (DNR)  Continuous    Question Answer Comment  In the event of cardiac or respiratory ARREST Do not call a "code blue"   In the event of cardiac or respiratory ARREST Do not perform Intubation, CPR,  defibrillation or ACLS   In the event of cardiac or respiratory ARREST Use medication by any route, position, wound care, and other measures to relive pain and suffering. May use oxygen, suction and manual treatment of airway obstruction as needed for comfort.      01/20/18 0007    Code Status History    Date Active Date Inactive Code Status Order ID Comments User Context   09/26/2017 0244 09/27/2017 2055 DNR [id number]  Vianne Bulls, MD ED   09/26/2017 0242 09/26/2017 0244 Full Code [id number]  Vianne Bulls, MD ED   10/26/2015 1851 10/29/2015 2020 DNR [id number]  Eugenie Filler, MD Inpatient      Home/SNF/Other Home  Chief Complaint FTT  Level of Care/Admitting Diagnosis ED Disposition    ED Disposition Condition [facility] Area: [facility] [[id number]]  Level of Care: Telemetry [5]  Admit to tele based on following criteria: Complex arrhythmia (Bradycardia/Tachycardia)  Diagnosis: Failure to thrive in adult [358490]  Admitting Physician: Norval Morton [[id number]]  Attending Physician: Norval Morton [[id number]]  Estimated length of stay: past midnight tomorrow  Certification:: I certify this patient will need inpatient services for at least 2 midnights  PT Class (Do Not Modify): Inpatient [101]  PT Acc Code (Do Not Modify): Private [1]       Medical History Past Medical History:  Diagnosis Date  . B12 deficiency 10/26/2015  . Bundle branch block, left   . CKD (chronic kidney disease), stage IV ([place]) 10/26/2015  . Hypertension   . Hypothyroidism 10/26/2015  . Osteoarthritis   .  Pernicious anemia   . Stroke ([place])   . Thyroid disease     Allergies No Known Allergies  IV Location/Drains/Wounds Patient Lines/Drains/Airways Status   Active Line/Drains/Airways    Name:   Placement date:   Placement time:   Site:   Days:   Peripheral IV 01/19/18 Left Antecubital   01/19/18    1631    Antecubital   1   External Urinary Catheter   09/26/17     0318    -   116          Labs/Imaging Results for orders placed or performed during the hospital encounter of 01/19/18 (from the past 48 hour(s))  Basic metabolic panel     Status: Abnormal   Collection Time: 01/19/18  5:59 PM  Result Value Ref Range   Sodium 137 135 - 145 mmol/L   Potassium 3.2 (L) 3.5 - 5.1 mmol/L   Chloride 95 (L) 101 - 111 mmol/L   CO2 30 22 - 32 mmol/L   Glucose, Bld 127 (H) 65 - 99 mg/dL   BUN 52 (H) 6 - 20 mg/dL   Creatinine, Ser 1.83 (H) 0.44 - 1.00 mg/dL   Calcium 8.4 (L) 8.9 - 10.3 mg/dL   GFR calc non Af Amer 22 (L) >60 mL/min   GFR calc Af Amer 25 (L) >60 mL/min    Comment: (NOTE) The eGFR has been calculated using the CKD EPI equation. This calculation has not been validated in all clinical situations. eGFR's persistently <60 mL/min signify possible Chronic Kidney Disease.    Anion gap 12 5 - 15    Comment: Performed at [facility], [place] [address], [place], [place] [address]  CBC     Status: Abnormal   Collection Time: 01/19/18  5:59 PM  Result Value Ref Range   WBC 9.5 4.0 - 10.5 K/uL   RBC 3.55 (L) 3.87 - 5.11 MIL/uL   Hemoglobin 11.1 (L) 12.0 - 15.0 g/dL   HCT 33.8 (L) 36.0 - 46.0 %   MCV 95.2 78.0 - 100.0 fL   MCH 31.3 26.0 - 34.0 pg   MCHC 32.8 30.0 - 36.0 g/dL   RDW 13.6 11.5 - 15.5 %   Platelets 169 150 - 400 K/uL    Comment: Performed at [facility], [place] [address] [place], [place] [address]  Urinalysis, Routine w reflex microscopic     Status: Abnormal   Collection Time: 01/19/18  9:29 PM  Result Value Ref Range   Color, Urine YELLOW YELLOW   APPearance HAZY (A) CLEAR   Specific Gravity, Urine 1.017 1.005 - 1.030   pH 6.0 5.0 - 8.0   Glucose, UA NEGATIVE NEGATIVE mg/dL   Hgb urine dipstick SMALL (A) NEGATIVE   Bilirubin Urine NEGATIVE NEGATIVE   Ketones, ur 5 (A) NEGATIVE mg/dL   Protein, ur 30 (A) NEGATIVE mg/dL   Nitrite POSITIVE (A) NEGATIVE   Leukocytes, UA NEGATIVE NEGATIVE    RBC / HPF 0-5 0 - 5 RBC/hpf   WBC, UA 0-5 0 - 5 WBC/hpf   Bacteria, UA MANY (A) NONE SEEN   Squamous Epithelial / LPF 0-5 0 - 5   Mucus PRESENT     Comment: Performed at [facility], [place] [address], [place], [place] [address]   Dg Chest 2 View  Result Date: 01/19/2018 CLINICAL DATA:  Weakness EXAM: CHEST - 2 VIEW COMPARISON:  10/26/2015 FINDINGS: Mild hyperinflation. Numerous leads and wires project over the chest.  Midline trachea. Mild cardiomegaly. Atherosclerosis in the transverse aorta. Tortuous thoracic aorta. No pleural effusion or pneumothorax. No congestive failure. Clear lungs. IMPRESSION: No acute cardiopulmonary disease. Cardiomegaly without congestive failure. Aortic Atherosclerosis (ICD10-I70.0). Hyperinflation. Electronically Signed   By: Abigail Miyamoto M.D.   On: 01/19/2018 18:45    Pending Labs Unresulted Labs (From admission, onward)   Start     Ordered   01/20/18 0500  CBC  Tomorrow morning,   R     01/20/18 0007   01/20/18 0500  Magnesium  Tomorrow morning,   R     01/20/18 0007   01/20/18 0500  Comprehensive metabolic panel  Tomorrow morning,   R     01/20/18 0007   01/20/18 0500  Prealbumin  Tomorrow morning,   R     01/20/18 0101   01/19/18 2322  Troponin I  Add-on,   R     01/19/18 2321   01/19/18 2221  Urine culture  STAT,   STAT     01/19/18 2220      Vitals/Pain Today's Vitals   01/19/18 2030 01/19/18 2130 01/19/18 2250 01/19/18 2335  BP: 132/63 (!) 150/62 138/63   Pulse: (!) 42 (!) 44 (!) 57   Resp: 16 12 14    Temp:      TempSrc:      SpO2: 100% 100% 100%   Weight:      PainSc:    Asleep    Isolation Precautions No active isolations  Medications Medications  levothyroxine (SYNTHROID, LEVOTHROID) tablet 112 mcg (has no administration in time range)  latanoprost (XALATAN) 0.005 % ophthalmic solution 1 drop (has no administration in time range)  dorzolamide-timolol (COSOPT) 22.3-6.8 MG/ML ophthalmic solution 1 drop (has no  administration in time range)  clopidogrel (PLAVIX) tablet 75 mg (has no administration in time range)  atorvastatin (LIPITOR) tablet 10 mg (has no administration in time range)  enoxaparin (LOVENOX) injection 30 mg (has no administration in time range)  ondansetron (ZOFRAN) tablet 4 mg (has no administration in time range)    Or  ondansetron (ZOFRAN) injection 4 mg (has no administration in time range)  acetaminophen (TYLENOL) tablet 650 mg (has no administration in time range)    Or  acetaminophen (TYLENOL) suppository 650 mg (has no administration in time range)  albuterol (PROVENTIL) (2.5 MG/3ML) 0.083% nebulizer solution 2.5 mg (has no administration in time range)  potassium chloride 10 mEq in 100 mL IVPB (has no administration in time range)  sodium chloride 0.9 % bolus 1,000 mL (0 mLs Intravenous Stopped 01/19/18 1942)  0.9 %  sodium chloride infusion ( Intravenous Transfusing/Transfer 01/19/18 2344)  cefTRIAXone (ROCEPHIN) 1 g in sodium chloride 0.9 % 100 mL IVPB (0 g Intravenous Stopped 01/19/18 2322)    Mobility non-ambulatory

## 2018-01-20 NOTE — Care Management Note (Signed)
Case Management Note  Patient Details  Name: Cullen Vanallen MRN: [id number] Date of Birth: Aug 21, 1918  Subjective/Objective:  84 y/o f admitted w/FTT.DNR. From home. PT cons-await recc.                  Action/Plan:d/c SNF.   Expected Discharge Date:                  Expected Discharge Plan:  Skilled Nursing Facility  In-House Referral:  Clinical Social Work  Discharge planning Services  CM Consult  Post Acute Care Choice:    Choice offered to:     DME Arranged:    DME Agency:     HH Arranged:    HH Agency:     Status of Service:  In process, will continue to follow  If discussed at Long Length of Stay Meetings, dates discussed:    Additional Comments:  Lanier Clam, RN 01/20/2018, 10:51 AM

## 2018-01-21 ENCOUNTER — Inpatient Hospital Stay (HOSPITAL_COMMUNITY): Payer: Medicare Other

## 2018-01-21 DIAGNOSIS — E43 Unspecified severe protein-calorie malnutrition: Secondary | ICD-10-CM

## 2018-01-21 DIAGNOSIS — I361 Nonrheumatic tricuspid (valve) insufficiency: Secondary | ICD-10-CM

## 2018-01-21 LAB — CBC
HCT: 35.1 % — ABNORMAL LOW (ref 36.0–46.0)
Hemoglobin: 11.4 g/dL — ABNORMAL LOW (ref 12.0–15.0)
MCH: 30.9 pg (ref 26.0–34.0)
MCHC: 32.5 g/dL (ref 30.0–36.0)
MCV: 95.1 fL (ref 78.0–100.0)
PLATELETS: 169 10*3/uL (ref 150–400)
RBC: 3.69 MIL/uL — ABNORMAL LOW (ref 3.87–5.11)
RDW: 13.6 % (ref 11.5–15.5)
WBC: 14.4 10*3/uL — AB (ref 4.0–10.5)

## 2018-01-21 LAB — BASIC METABOLIC PANEL
Anion gap: 11 (ref 5–15)
BUN: 36 mg/dL — ABNORMAL HIGH (ref 6–20)
CALCIUM: 7.7 mg/dL — AB (ref 8.9–10.3)
CO2: 22 mmol/L (ref 22–32)
Chloride: 104 mmol/L (ref 101–111)
Creatinine, Ser: 1.32 mg/dL — ABNORMAL HIGH (ref 0.44–1.00)
GFR, EST AFRICAN AMERICAN: 38 mL/min — AB (ref >60)
GFR, EST NON AFRICAN AMERICAN: 32 mL/min — AB (ref >60)
Glucose, Bld: 92 mg/dL (ref 65–99)
Potassium: 4.1 mmol/L (ref 3.5–5.1)
Sodium: 137 mmol/L (ref 135–145)

## 2018-01-21 LAB — ECHOCARDIOGRAM LIMITED
HEIGHTINCHES: 64 in
WEIGHTICAEL: 1509.71 [oz_av]

## 2018-01-21 LAB — MAGNESIUM: Magnesium: 1.7 mg/dL (ref 1.7–2.4)

## 2018-01-21 MED ORDER — ENSURE ENLIVE PO LIQD
237.0000 mL | Freq: Two times a day (BID) | ORAL | Status: DC
  Administered 2018-01-21 – 2018-01-23 (×4): 237 mL via ORAL

## 2018-01-21 MED ORDER — ADULT MULTIVITAMIN W/MINERALS CH
1.0000 | ORAL_TABLET | Freq: Every day | ORAL | Status: DC
  Administered 2018-01-21 – 2018-01-22 (×2): 1 via ORAL
  Filled 2018-01-21 (×2): qty 1

## 2018-01-21 NOTE — NC FL2 (Signed)
[place] MEDICAID FL2 LEVEL OF CARE SCREENING TOOL     IDENTIFICATION  Patient Name: Jennifer Pittman Birthdate: Jan 22, 1918 Sex: female Admission Date (Current Location): 01/19/2018  [facility] and [place] Number:  Producer, television/film/video and Address:  [facility],  [address] [place], [place] [address]      Provider Number: [id number]  Attending Physician Name and Address:  Rolly Salter, MD  Relative Name and Phone Number:       Current Level of Care: Hospital Recommended Level of Care: Skilled Nursing Facility Prior Approval Number:    Date Approved/Denied:   PASRR Number: [id number]  Discharge Plan: SNF    Current Diagnoses: Patient Active Problem List   Diagnosis Date Noted  . Protein-calorie malnutrition, severe 01/21/2018  . Failure to thrive in adult 01/20/2018  . Dehydration 01/20/2018  . Acute kidney injury superimposed on chronic kidney disease (HCC) 01/20/2018  . Dysphasia 01/20/2018  . Dysphagia 01/20/2018  . Abnormal urinalysis 01/20/2018  . Hypokalemia 01/20/2018  . Bradycardia 01/20/2018  . Weakness of right leg 09/26/2017  . Right leg weakness 09/26/2017  . Acute CVA (cerebrovascular accident) (HCC) 10/27/2015  . History of CVA (cerebrovascular accident) 10/26/2015  . Facial droop 10/26/2015  . HTN (hypertension) 10/26/2015  . BBB (bundle branch block) 10/26/2015  . Pernicious anemia 10/26/2015  . Osteoarthritis 10/26/2015  . E. coli UTI: 10/19/2015 at PCP office 10/26/2015  . B12 deficiency 10/26/2015  . Hypothyroidism 10/26/2015  . Hyperlipidemia 10/26/2015  . CKD (chronic kidney disease), stage IV (HCC) 10/26/2015  . Elevated troponin   . Slurred speech     Orientation RESPIRATION BLADDER Height & Weight     Self, Place  Normal External catheter, Incontinent(incontinent at times) Weight: 94 lb 5.7 oz (42.8 kg) Height:   (162.6 cm)  BEHAVIORAL SYMPTOMS/MOOD NEUROLOGICAL BOWEL NUTRITION STATUS      Continent  Diet(dysphasia III diet)  AMBULATORY STATUS COMMUNICATION OF NEEDS Skin   Limited Assist   Normal                       Personal Care Assistance Level of Assistance  Bathing, Feeding, Dressing Bathing Assistance: Limited assistance Feeding assistance: Limited assistance(needs to be set up) Dressing Assistance: Limited assistance     Functional Limitations Info  Sight, Hearing, Speech Sight Info: Adequate Hearing Info: Adequate Speech Info: Adequate    SPECIAL CARE FACTORS FREQUENCY  PT (By licensed PT), OT (By licensed OT), Speech therapy     PT Frequency: 5x OT Frequency: 5x     Speech Therapy Frequency: 1x      Contractures Contractures Info: Not present    Additional Factors Info  Code Status, Allergies Code Status Info: DNR Allergies Info: nka           Current Medications (01/21/2018):  This is the current hospital active medication list Current Facility-Administered Medications  Medication Dose Route Frequency Provider Last Rate Last Dose  . acetaminophen (TYLENOL) tablet 650 mg  650 mg Oral Q6H PRN Clydie Braun, MD       Or  . acetaminophen (TYLENOL) suppository 650 mg  650 mg Rectal Q6H PRN Smith, Rondell A, MD      . albuterol (PROVENTIL) (2.5 MG/3ML) 0.083% nebulizer solution 2.5 mg  2.5 mg Nebulization Q4H PRN Smith, Rondell A, MD      . atorvastatin (LIPITOR) tablet 10 mg  10 mg Oral q1800 Madelyn Flavors A, MD   10 mg at 01/20/18 1806  .  cefTRIAXone (ROCEPHIN) 1 g in sodium chloride 0.9 % 100 mL IVPB  1 g Intravenous Q24H Clydie Braun, MD   Stopped at 01/20/18 2211  . clopidogrel (PLAVIX) tablet 75 mg  75 mg Oral Daily Madelyn Flavors A, MD   75 mg at 01/21/18 0944  . dorzolamide-timolol (COSOPT) 22.3-6.8 MG/ML ophthalmic solution 1 drop  1 drop Both Eyes BID Madelyn Flavors A, MD   1 drop at 01/21/18 0944  . enoxaparin (LOVENOX) injection 30 mg  30 mg Subcutaneous QHS Smith, Rondell A, MD   30 mg at 01/20/18 2140  . feeding supplement (ENSURE  ENLIVE) (ENSURE ENLIVE) liquid 237 mL  237 mL Oral BID BM Rolly Salter, MD      . latanoprost (XALATAN) 0.005 % ophthalmic solution 1 drop  1 drop Both Eyes QHS Madelyn Flavors A, MD   1 drop at 01/20/18 2141  . levothyroxine (SYNTHROID, LEVOTHROID) tablet 112 mcg  112 mcg Oral QAC breakfast Madelyn Flavors A, MD   112 mcg at 01/21/18 0753  . multivitamin with minerals tablet 1 tablet  1 tablet Oral Daily Rolly Salter, MD      . ondansetron [facility]) tablet 4 mg  4 mg Oral Q6H PRN Madelyn Flavors A, MD       Or  . ondansetron (ZOFRAN) injection 4 mg  4 mg Intravenous Q6H PRN Clydie Braun, MD         Discharge Medications: Please see discharge summary for a list of discharge medications.  Relevant Imaging Results:  Relevant Lab Results:   Additional Information SS# [id number]  Nelwyn Salisbury, LCSW

## 2018-01-21 NOTE — Clinical Social Work Note (Signed)
Clinical Social Work Assessment  Patient Details  Name: Jennifer Pittman MRN: [id number] Date of Birth: 05/10/1918  Date of referral:  01/21/18               Reason for consult:  Facility Placement                Permission sought to share information with:  Facility Medical sales representative, Family Supports Permission granted to share information::  Yes, Verbal Permission Granted  Name::     Shanitra Phillippi; Molinda Bailiff  Agency::     Relationship::  Daughter in Social worker; Son  Contact Information:  [phone]; [phone]  (home [phone])  Housing/Transportation Living arrangements for the past 2 months:  Single Family Home Source of Information:  Patient, Adult Children Patient Interpreter Needed:  None Criminal Activity/Legal Involvement Pertinent to Current Situation/Hospitalization:  No - Comment as needed Significant Relationships:  Adult Children Lives with:  Adult Children Do you feel safe going back to the place where you live?  (PT recommending SNF) Need for family participation in patient care:  Yes (Comment)  Care giving concerns:  Patient from home with DIL and son. Patient's son reported that patient was independent with bathing and dressing up until 2 weeks ago. Patient's son reported that patient has been having issues with weakness and walking for the past month. Patient's son reported that patient's daughter in law is patient's primary caregiver. PT recommending SNF for ST rehab.   Social Worker assessment / plan:  CSW spoke with patient and patient's son at bedside regarding PT recommendation for SNF. Patient/patient's son agreeable to SNF. CSW explained SNF placement process and medicare coverage, patient's son verbalized understanding. Patient's son reported that they prefer [facility]. Patient's son reported that they may be interested in [facility] in the future. CSW explained the difference between ST rehab versus [facility] term care. Patient's son reported that  they are interested in pursuing ST rehab initially. CSW agreed to complete's patient's FL2 and follow up with [facility] SNF. Patient reported that she is in agreement for SNF and prefers that CSW speak with her daughter in law or son due to her memory issues.  CSW will complete patient's FL2 and follow up with [facility] SNF.  CSW will continue to follow and assist with discharge planning.  Employment status:  Retired Health and safety inspector:  Medicare PT Recommendations:  Skilled Nursing Facility Information / Referral to community resources:  Skilled Nursing Facility  Patient/Family's Response to care:  Patient/patient's son appreciative of CSW assistance with discharge planning.   Patient/Family's Understanding of and Emotional Response to Diagnosis, Current Treatment, and Prognosis:  Patient presented calm and deferred to son to discuss discharge planning. Patient's son involved in patient's care and verbalized understanding of current treatment plan. Patient's son reported that they will pursue [facility] SNF after ST rehab.   Emotional Assessment Appearance:  Appears stated age Attitude/Demeanor/Rapport:  Other(Cooperative) Affect (typically observed):  Appropriate, Calm Orientation:  Oriented to Self, Oriented to Place, Oriented to  Time, Oriented to Situation(Patient reported that she has issues with her memory, patient's son also reported that patient has issues with her memory) Alcohol / Substance use:  Not Applicable Psych involvement (Current and /or in the community):  No (Comment)  Discharge Needs  Concerns to be addressed:  Care Coordination Readmission within the last 30 days:  No Current discharge risk:  Physical Impairment Barriers to Discharge:  Continued Medical Work up   [employer],  LCSW 01/21/2018, 12:22 PM

## 2018-01-21 NOTE — Progress Notes (Signed)
[facility] Progress Note  Patient: Jennifer Pittman WUJ:[id number]   PCP: Georgianne Fick, MD DOB: 1918-02-07   DOA: 01/19/2018   DOS: 01/21/2018   Date of Service: the patient was seen and examined on 01/21/2018  Subjective: Feeling better, more fatigue.  No nausea no vomiting.  Still no chest pain or heaviness or shortness of breath.  Brief hospital course: Pt. with PMH of HTN, HLD, hypothyroidism, dysphagia with esophageal stricture, CVA,; admitted on 01/19/2018, presented with complaint of fall, was found to have non-STEMI, acute kidney injury from dehydration. Currently further plan is continue IV antibiotics  Assessment and Plan: Failure to thrive/generalized weakness  excellent severe malnutrition in context of chronic illness underweight. Patient presents with worsening weakness and frequent falls due to poor overall p.o. intake related with dysphagia Patient has 6 pound weight loss in the last few months. Appreciate dietary input. Started on Ensure, changing from full liquid diet to dysphagia 3 diet for mechanical soft component.  Acute kidney injury superimposed on chronic kidney disease stage III 2/2 Dehydration,  Serum creatinine 1.8, BUN 52 on admission. Baseline creatinine 1.58, BUN and 30s. Currently serum creatinine 1.32 BUN 36. Suspecting chronic dehydration causing chronic kidney disease. At present we will hold IV fluid and monitor.  Dysphagia, esophageal stricture: Patient previously noted to have esophageal stricture for which she declined esophageal stretching due to pain.   - Aspiration precautions - Elevate head of bed - Dysphagia 3 diet.  For mechanical soft component  E. coli UTI. Patient was found to have negative leukocytes positive nitrites, many bacteria, negative squamous epithelium cells, and negative WBCs.  Continue IV Rocephin for now. Wait for susceptibility.  Hypokalemia: Acute.  Patient presents with potassium 3.2 on  admission. Corrected.  Monitor.  Bradycardia: On admission patient's heart rates noted to be in lower 40's on admission.  No significant events on telemetry, currently bradycardia has resolved.  CAD history Elevated troponin, likely non-STEMI - Continue Plavix -Echocardiogram shows 45 to 50% EF improved from prior. Patient has wall motion abnormalities. Patient denies any chest pain shortness of breath.  EKG does not show any evidence of acute ischemia. Troponin elevation could be secondary to demand ischemia due to UTI and dehydration versus poor clearance due to acute kidney injury. Regardless of the case the patient is not a candidate for intervention due to her advanced age and other comorbidities. Continue Plavix for now.  Orthostatic hypotension, history of essential hypertension - Hold hydrochlorothiazide, amlodipine,  and Micardis at this time  Diastolic CHF, cardiomyopathy- Strict intake and output - Daily weights Holding home oral diuretic agents.  Anemia chronic disease: Hemoglobin appears to be near patient's baseline of around 11. - Continue to monitor  Hypothyroidism - Continue levothyroxine  Falls: Patient likely having falls related to Dehydration and overall weakness.  Family reports patient needs assistance with all ADLs at this time.   PT consulted recommends SNF.  Monitor.  Diet: Dysphagia 3 diet DVT Prophylaxis: subcutaneous Heparin  Advance goals of care discussion: DNR/DNI  Family Communication: family was present at bedside, at the time of interview. The pt provided permission to discuss medical plan with the family. Opportunity was given to ask question and all questions were answered satisfactorily.   Disposition:  Discharge to SNF likely on 01/23/2018  Consultants: none Procedures: none  Antibiotics: Anti-infectives (From admission, onward)   Start     Dose/Rate Route Frequency Ordered Stop   01/20/18 2200  cefTRIAXone (ROCEPHIN) 1 g in  sodium chloride 0.9 %  100 mL IVPB     1 g 200 mL/hr over 30 Minutes Intravenous Every 24 hours 01/20/18 0128     01/19/18 2230  cefTRIAXone (ROCEPHIN) 1 g in sodium chloride 0.9 % 100 mL IVPB     1 g 200 mL/hr over 30 Minutes Intravenous  Once 01/19/18 2219 01/19/18 2322       Objective: Physical Exam: Vitals:   01/20/18 1421 01/20/18 2058 01/21/18 0431 01/21/18 1335  BP: 106/73 125/61 (!) 154/70 (!) 125/54  Pulse: (!) 50 67 63 (!) 50  Resp: Temp: (!) 97.5 F (36.4 C) 98.1 F (36.7 C) 98.9 F (37.2 C) (!) 97.4 F (36.3 C)  TempSrc: Oral Oral Oral Oral  SpO2: 91% 90% 94% 98%  Weight:   42.8 kg (94 lb 5.7 oz)   Height:        Intake/Output Summary (Last 24 hours) at 01/21/2018 1725 Last data filed at 01/21/2018 1000 Gross per 24 hour  Intake 220 ml  Output 200 ml  Net 20 ml   Filed Weights   01/19/18 1644 01/20/18 0205 01/21/18 0431  Weight: 42.6 kg (94 lb) 43 kg (94 lb 12.8 oz) 42.8 kg (94 lb 5.7 oz)   General: Alert, Awake and Oriented to Time, Place and Person. Appear in moderate distress, affect appropriate Eyes: PERRL, Conjunctiva normal ENT: Oral Mucosa clear moist. Neck: no JVD, no Abnormal Mass Or lumps Cardiovascular: S1 and S2 Present, no Murmur, Peripheral Pulses Present Respiratory: normal respiratory effort, Bilateral Air entry equal and Decreased, no use of accessory muscle, Clear to Auscultation, no Crackles, no wheezes Abdomen: Bowel Sound present, Soft and no tenderness, no hernia Skin: no redness, no Rash, no induration Extremities: no Pedal edema, no calf tenderness Neurologic: Grossly no focal neuro deficit. Bilaterally Equal motor strength  Data Reviewed: CBC: Recent Labs  Lab 01/19/18 1759 01/20/18 0211 01/21/18 0535  WBC 9.5 7.8 14.4*  HGB 11.1* 10.5* 11.4*  HCT 33.8* 32.2* 35.1*  MCV 95.2 94.7 95.1  PLT 169 160 169   Basic Metabolic Panel: Recent Labs  Lab 01/19/18 1759 01/20/18 0211 01/21/18 0535  NA 137 138 137  K  3.2* 3.1* 4.1  CL 95* 102 104  CO2 GLUCOSE 127* 78 92  BUN 52* 46* 36*  CREATININE 1.83* 1.57* 1.32*  CALCIUM 8.4* 7.8* 7.7*  MG  --  1.3* 1.7    Liver Function Tests: Recent Labs  Lab 01/20/18 0211  AST 16  ALT 10*  ALKPHOS 47  BILITOT 0.5  PROT 5.3*  ALBUMIN 2.9*   No results for input(s): LIPASE, AMYLASE in the last 168 hours. No results for input(s): AMMONIA in the last 168 hours. Coagulation Profile: No results for input(s): INR, PROTIME in the last 168 hours. Cardiac Enzymes: Recent Labs  Lab 01/20/18 0211 01/20/18 1142 01/20/18 1617 01/20/18 2249  TROPONINI 0.46* 0.30* 0.28* 0.26*   BNP (last 3 results) No results for input(s): PROBNP in the last 8760 hours. CBG: No results for input(s): GLUCAP in the last 168 hours. Studies: No results found.  Scheduled Meds: . atorvastatin  10 mg Oral q1800  . clopidogrel  75 mg Oral Daily  . dorzolamide-timolol  1 drop Both Eyes BID  . enoxaparin (LOVENOX) injection  30 mg Subcutaneous QHS  . feeding supplement (ENSURE ENLIVE)  237 mL Oral BID BM  . latanoprost  1 drop Both Eyes QHS  . levothyroxine  112 mcg Oral QAC breakfast  .  multivitamin with minerals  1 tablet Oral Daily   Continuous Infusions: . cefTRIAXone (ROCEPHIN)  IV Stopped (01/20/18 2211)   PRN Meds: acetaminophen **OR** acetaminophen, albuterol, ondansetron **OR** ondansetron (ZOFRAN) IV  Time spent: 35 minutes  Author: Lynden Oxford, MD [facility] Pager: [phone] 01/21/2018 5:25 PM  If 7PM-7AM, please contact night-coverage at [email or web address], password [facility]

## 2018-01-21 NOTE — Evaluation (Addendum)
Physical Therapy Evaluation Patient Details Name: Jennifer Pittman MRN: [id number] DOB: 1918-03-13 Today's Date: 01/21/2018   History of Present Illness  84 y.o. female with  PMH of HTN, HLD, hypothyroidism, dysphagia with esophageal stricture, CVA,; admitted on 01/19/2018, presented with complaint of fall, was found to have non-STEMI, acute kidney injury from dehydration.  Clinical Impression  Pt admitted with above diagnosis. Pt currently with functional limitations due to the deficits listed below (see PT Problem List). Min/guard assist to ambulate 15' with RW. HR 91 with ambulation, distance limited by fatigue.  Pt has had 3 falls in past 1 month and is at high risk for further falls. SNF recommended.  Pt will benefit from skilled PT to increase their independence and safety with mobility to allow discharge to the venue listed below.       Follow Up Recommendations SNF;Supervision for mobility/OOB    Equipment Recommendations  None recommended by PT    Recommendations for Other Services OT consult     Precautions / Restrictions Precautions Precautions: Fall Precaution Comments: 3 falls in past 1 month, had another "big" fall in January required hospitalization Restrictions Weight Bearing Restrictions: No      Mobility  Bed Mobility Overal bed mobility: Needs Assistance Bed Mobility: Supine to Sit     Supine to sit: Supervision;HOB elevated     General bed mobility comments: supervision for safety, no assist needed  Transfers Overall transfer level: Needs assistance Equipment used: Rolling walker (2 wheeled) Transfers: Sit to/from Stand Sit to Stand: Min assist         General transfer comment: min A to rise, VCs hand placement  Ambulation/Gait Ambulation/Gait assistance: Min guard Ambulation Distance (Feet): 15 Feet Assistive device: Rolling walker (2 wheeled) Gait Pattern/deviations: Step-through pattern;Narrow base of support     General Gait Details:  VCs to widen BOS, no loss of balance, distance limited by fatigue, HR 91  Stairs            Wheelchair Mobility    Modified Rankin (Stroke Patients Only)       Balance Overall balance assessment: Needs assistance;History of Falls Sitting-balance support: Feet supported Sitting balance-Leahy Scale: Fair     Standing balance support: Bilateral upper extremity supported Standing balance-Leahy Scale: Poor Standing balance comment: relies on BUE suport                             Pertinent Vitals/Pain Pain Assessment: No/denies pain    Home Living Family/patient expects to be discharged to:: Skilled nursing facility Living Arrangements: Children               Additional Comments: pt lived with son and DIL, they assisted with bathing/dressing, pt ambulated with RW    Prior Function Level of Independence: Needs assistance   Gait / Transfers Assistance Needed: used RW short distances, decline in last 3 weeks, 3 falls in past 1 month  ADL's / Homemaking Assistance Needed: assist needed  Comments: Uses RW at home      Hand Dominance   Dominant Hand: Right    Extremity/Trunk Assessment   Upper Extremity Assessment Upper Extremity Assessment: Defer to OT evaluation    Lower Extremity Assessment Lower Extremity Assessment: Generalized weakness(B knee ext 4/5, edema noted R lateral foot dorsal surface, pt./son reports this is chronic)    Cervical / Trunk Assessment Cervical / Trunk Assessment: Kyphotic  Communication   Communication: No difficulties  Cognition Arousal/Alertness: Awake/alert  Behavior During Therapy: WFL for tasks assessed/performed Overall Cognitive Status: Within Functional Limits for tasks assessed                                        General Comments      Exercises General Exercises - Lower Extremity Ankle Circles/Pumps: AROM;10 reps;Seated   Assessment/Plan    PT Assessment Patient needs continued  PT services  PT Problem List Decreased strength;Decreased balance;Decreased mobility;Decreased activity tolerance       PT Treatment Interventions DME instruction;Gait training;Functional mobility training;Therapeutic activities;Therapeutic exercise;Balance training;Patient/family education    PT Goals (Current goals can be found in the Care Plan section)  Acute Rehab PT Goals Patient Stated Goal: wants to get home to her special chair, son would like SNF PT Goal Formulation: With patient/family Time For Goal Achievement: 02/04/18 Potential to Achieve Goals: Good    Frequency Min 2X/week   Barriers to discharge Decreased caregiver support family reports they are unable to provide care for pt    Co-evaluation PT/OT/SLP Co-Evaluation/Treatment: Yes Reason for Co-Treatment: For patient/therapist safety PT goals addressed during session: Mobility/safety with mobility;Proper use of DME;Balance         AM-PAC PT "6 Clicks" Daily Activity  Outcome Measure Difficulty turning over in bed (including adjusting bedclothes, sheets and blankets)?: A Little Difficulty moving from lying on back to sitting on the side of the bed? : A Little Difficulty sitting down on and standing up from a chair with arms (e.g., wheelchair, bedside commode, etc,.)?: Unable Help needed moving to and from a bed to chair (including a wheelchair)?: A Little Help needed walking in hospital room?: A Little Help needed climbing 3-5 steps with a railing? : Total 6 Click Score: 14    End of Session Equipment Utilized During Treatment: Gait belt Activity Tolerance: Patient tolerated treatment well Patient left: in chair;with chair alarm set;with family/visitor present Nurse Communication: Mobility status PT Visit Diagnosis: Difficulty in walking, not elsewhere classified (R26.2);Muscle weakness (generalized) (M62.81);History of falling (Z91.81);Repeated falls (R29.6);Other abnormalities of gait and mobility  (R26.89)    Time: 4098-1191 PT Time Calculation (min) (ACUTE ONLY): 23 min   Charges:   PT Evaluation $PT Eval Low Complexity: 1 Low     PT G Codes:          Tamala Ser 01/21/2018, 10:57 AM [phone]

## 2018-01-21 NOTE — Evaluation (Signed)
Occupational Therapy Evaluation Patient Details Name: Jennifer Pittman MRN: [id number] DOB: 1918/04/23 Today's Date: 01/21/2018    History of Present Illness 84 y.o. female with  PMH of HTN, HLD, hypothyroidism, dysphagia with esophageal stricture, CVA,; admitted on 01/19/2018, presented with complaint of fall, was found to have non-STEMI, acute kidney injury from dehydration.   Clinical Impression   Pt was admitted for the above. She has had recent decline in function over the past 3 weeks and needed increased assistance from family for ADLs and mobility.  She fatiques easily. Pt will benefit from continued OT in acute setting as well as continued post acute OT at SNF. Goals are for min to mod A. She needs min A for ambulating and up to total A for LB adls at this time     Follow Up Recommendations  SNF    Equipment Recommendations  3 in 1 bedside commode    Recommendations for Other Services       Precautions / Restrictions Precautions Precautions: Fall Precaution Comments: 3 falls in past 1 month, had another "big" fall in January required hospitalization Restrictions Weight Bearing Restrictions: No      Mobility Bed Mobility Overal bed mobility: Needs Assistance Bed Mobility: Supine to Sit     Supine to sit: Supervision;HOB elevated     General bed mobility comments: supervision for safety, no assist needed  Transfers Overall transfer level: Needs assistance Equipment used: Rolling walker (2 wheeled) Transfers: Sit to/from Stand Sit to Stand: Min assist         General transfer comment: min A to rise, VCs hand placement    Balance Overall balance assessment: Needs assistance;History of Falls Sitting-balance support: Feet supported Sitting balance-Leahy Scale: Fair     Standing balance support: Bilateral upper extremity supported Standing balance-Leahy Scale: Poor Standing balance comment: relies on BUE suport                           ADL  either performed or assessed with clinical judgement   ADL Overall ADL's : Needs assistance/impaired Eating/Feeding: Set up   Grooming: Minimal assistance   Upper Body Bathing: Moderate assistance   Lower Body Bathing: Total assistance   Upper Body Dressing : Moderate assistance   Lower Body Dressing: Total assistance   Toilet Transfer: Minimal assistance;Ambulation;BSC   Toileting- Clothing Manipulation and Hygiene: Maximal assistance;Sit to/from stand         General ADL Comments: pt had assistance for adls prior to admission; limited due to decreased strength and endurance.  Pt fatiques easily. Son present; pt has been declining more for paast 3 weeks     Vision         Perception     Praxis      Pertinent Vitals/Pain Pain Assessment: No/denies pain     Hand Dominance Right   Extremity/Trunk Assessment Upper Extremity Assessment Upper Extremity Assessment: Generalized weakness   Lower Extremity Assessment Lower Extremity Assessment: Generalized weakness(B knee ext 4/5, edema noted R lateral foot dorsal surface, pt./son reports this is chronic)   Cervical / Trunk Assessment Cervical / Trunk Assessment: Kyphotic   Communication Communication Communication: No difficulties   Cognition Arousal/Alertness: Awake/alert Behavior During Therapy: WFL for tasks assessed/performed Overall Cognitive Status: Within Functional Limits for tasks assessed  General Comments       Exercises General Exercises - Lower Extremity Ankle Circles/Pumps: AROM;10 reps;Seated   Shoulder Instructions      Home Living Family/patient expects to be discharged to:: Skilled nursing facility Living Arrangements: Children                               Additional Comments: pt lived with son and DIL, they assisted with bathing/dressing, pt ambulated with RW      Prior Functioning/Environment Level of Independence:  Needs assistance  Gait / Transfers Assistance Needed: used RW short distances, decline in last 3 weeks, 3 falls in past 1 month ADL's / Homemaking Assistance Needed: assist needed   Comments: Uses RW at home         OT Problem List: Decreased strength;Decreased activity tolerance;Impaired balance (sitting and/or standing)      OT Treatment/Interventions: Self-care/ADL training;DME and/or AE instruction;Energy conservation;Patient/family education;Balance training    OT Goals(Current goals can be found in the care plan section) Acute Rehab OT Goals Patient Stated Goal: wants to get home to her special chair, son would like SNF OT Goal Formulation: With patient/family Time For Goal Achievement: 02/04/18 Potential to Achieve Goals: Good ADL Goals Pt Will Transfer to Toilet: with min guard assist;ambulating;bedside commode Pt Will Perform Toileting - Clothing Manipulation and hygiene: with min guard assist;sit to/from stand Additional ADL Goal #1: pt will complete UB adls with set up, extra time, seated Additional ADL Goal #2: Pt will complete LB adls with mod A, sit to stand with rest breaks for energy conservation  OT Frequency: Min 2X/week   Barriers to D/C:            Co-evaluation PT/OT/SLP Co-Evaluation/Treatment: Yes Reason for Co-Treatment: For patient/therapist safety PT goals addressed during session: Mobility/safety with mobility;Proper use of DME;Balance OT goals addressed during session: ADL's and self-care      AM-PAC PT "6 Clicks" Daily Activity     Outcome Measure Help from another person eating meals?: A Little Help from another person taking care of personal grooming?: A Little Help from another person toileting, which includes using toliet, bedpan, or urinal?: A Lot Help from another person bathing (including washing, rinsing, drying)?: A Lot Help from another person to put on and taking off regular upper body clothing?: A Lot Help from another person to  put on and taking off regular lower body clothing?: Total 6 Click Score: 13   End of Session Nurse Communication: Mobility status(check on pt after one hour)  Activity Tolerance: Patient limited by fatigue Patient left: in chair;with call bell/phone within reach;with chair alarm set;with family/visitor present  OT Visit Diagnosis: Muscle weakness (generalized) (M62.81);History of falling (Z91.81);Unsteadiness on feet (R26.81)                Time: 1610-9604 OT Time Calculation (min): 21 min Charges:  OT General Charges $OT Visit: 1 Visit OT Evaluation $OT Eval Low Complexity: 1 Low G-Codes:     [place], OTR/L [phone] 01/21/2018  Lilli Dewald 01/21/2018, 11:08 AM

## 2018-01-21 NOTE — Progress Notes (Signed)
  Echocardiogram 2D Echocardiogram has been performed.  Jennifer Pittman 01/21/2018, 9:05 AM

## 2018-01-21 NOTE — Progress Notes (Signed)
Initial Nutrition Assessment  DOCUMENTATION CODES:   Severe malnutrition in context of chronic illness, Underweight  INTERVENTION:  - Will order Ensure Enlive po BID, each supplement provides 350 kcal and 20 grams of protein - Will order daily multivitamin with minerals.  - Continue to encourage PO intakes.  - Will monitor for POC/GOC.   NUTRITION DIAGNOSIS:   Severe Malnutrition related to chronic illness(advanced age) as evidenced by severe fat depletion, severe muscle depletion.  GOAL:   Patient will meet greater than or equal to 90% of their needs  MONITOR:   PO intake, Supplement acceptance, Weight trends, Labs  REASON FOR ASSESSMENT:   Malnutrition Screening Tool  ASSESSMENT:   84 y.o. female with medical history significant of HTN, HLD, hypothyroidism, dysphasia, esophageal stricture, h/o CVA, h/o rheumatic fever as a child. She presented to the ED with progressively worsening weakness. Patient had been admitted into the hospital last at the end of January for weakness. At that time she was evaluated with CT and MRI imaging studies that were negative for any signs of acute stroke. Speech therapy noted significant dysphagia for which patient needed multiple swallows for each food bolus with globus sensation noted. Since being discharged home back with family patient has had a generalized progressive decline and multiple falls with the last being 2 weeks ago. She has not been eating much and reports that it is difficult for her to swallow and it takes a long time. She reports fear of having esophageal stretching for what appears to be known esophageal stricture. Other associated symptoms include decreased urination.  Denies any significant chest pain, dysuria, abdominal pain, nausea, vomiting, diarrhea, headache, or loss of consciousness.  At this point family report is needing almost full assistance for all ADLs and they are unable to care for her at home at this time.  Per  chart review, pt consumed 100% of breakfast and 75% of lunch yesterday (total of 863 kcal and 23 grams of protein) on FLD and 90% of breakfast this AM (427 kcal and 10 grams of protein) on Dysphagia 3 diet. Diet advanced from FLD to Dysphagia 3, thin liquids at 9:15 AM today.   No family/visitors present at the time of RD visit. Pt reported feeling very tired and she was unable to keep her eyes open more than to greet RD, state this, and give consent to NFPE. Per chart review, pt lost 21 lbs (18% body weight) in the past 22 months. This is not significant for time frame, but unable to determine if this weight loss has occurred more acutely.   Per Dr. Eliane Decree note yesterday evening: worsening AKI, pt on aspiration precautions d/t dysphagia with esophageal stricture and decline of esophageal stricture, falls thought to be 2/2 dehydration and overall weakness and family reported pt needs help with all ADLs.  Medications reviewed; 112 mcg oral Synthroid/day. Labs reviewed; BUN: 36 mg/dL, creatinine: 5.40 mg/dL, Ca: 7.7 mg/dL, GFR: 32 mL/min.       NUTRITION - FOCUSED PHYSICAL EXAM:    Most Recent Value  Orbital Region  Unable to assess  Upper Arm Region  Moderate depletion  Thoracic and Lumbar Region  Unable to assess  Buccal Region  Severe depletion  Temple Region  Mild depletion  Clavicle Bone Region  Severe depletion  Clavicle and Acromion Bone Region  Severe depletion  Scapular Bone Region  Unable to assess  Dorsal Hand  Unable to assess  Patellar Region  Moderate depletion  Anterior Thigh Region  Unable  to assess  Posterior Calf Region  Severe depletion  Edema (RD Assessment)  Unable to assess  Hair  Reviewed  Eyes  Reviewed  Mouth  Reviewed  Skin  Reviewed  Nails  Reviewed       Diet Order:   Diet Order           DIET DYS 3 Room service appropriate? Yes; Fluid consistency: Thin  Diet effective now          EDUCATION NEEDS:   No education needs have been identified at  this time  Skin:  Skin Assessment: Reviewed RN Assessment  Last BM:  5/28  Height:   Ht Readings from Last 1 Encounters:  01/20/18  (1.626 m)    Weight:   Wt Readings from Last 1 Encounters:  01/21/18 94 lb 5.7 oz (42.8 kg)    Ideal Body Weight:  54.54 kg  BMI:  Body mass index is 16.2 kg/m.  Estimated Nutritional Needs:   Kcal:  1285-1500 (30-35 kcal/kg)  Protein:  45-55 grams  Fluid:  >/= 1.5 L/day      Trenton Gammon, MS, RD, LDN, [facility] Inpatient Clinical Dietitian Pager # [phone] After hours/weekend pager # [phone]

## 2018-01-22 DIAGNOSIS — N390 Urinary tract infection, site not specified: Secondary | ICD-10-CM

## 2018-01-22 DIAGNOSIS — E43 Unspecified severe protein-calorie malnutrition: Secondary | ICD-10-CM

## 2018-01-22 LAB — CBC WITH DIFFERENTIAL/PLATELET
Basophils Absolute: 0 10*3/uL (ref 0.0–0.1)
Basophils Relative: 0 %
EOS ABS: 0.1 10*3/uL (ref 0.0–0.7)
EOS PCT: 1 %
HCT: 33.4 % — ABNORMAL LOW (ref 36.0–46.0)
HEMOGLOBIN: 10.8 g/dL — AB (ref 12.0–15.0)
LYMPHS ABS: 2.3 10*3/uL (ref 0.7–4.0)
Lymphocytes Relative: 17 %
MCH: 30.8 pg (ref 26.0–34.0)
MCHC: 32.3 g/dL (ref 30.0–36.0)
MCV: 95.2 fL (ref 78.0–100.0)
MONOS PCT: 5 %
Monocytes Absolute: 0.6 10*3/uL (ref 0.1–1.0)
NEUTROS PCT: 77 %
Neutro Abs: 10.1 10*3/uL — ABNORMAL HIGH (ref 1.7–7.7)
Platelets: 163 10*3/uL (ref 150–400)
RBC: 3.51 MIL/uL — ABNORMAL LOW (ref 3.87–5.11)
RDW: 13.9 % (ref 11.5–15.5)
WBC: 13.1 10*3/uL — ABNORMAL HIGH (ref 4.0–10.5)

## 2018-01-22 LAB — RENAL FUNCTION PANEL
Albumin: 2.5 g/dL — ABNORMAL LOW (ref 3.5–5.0)
Anion gap: 10 (ref 5–15)
BUN: 34 mg/dL — ABNORMAL HIGH (ref 6–20)
CALCIUM: 7.8 mg/dL — AB (ref 8.9–10.3)
CO2: 21 mmol/L — AB (ref 22–32)
Chloride: 102 mmol/L (ref 101–111)
Creatinine, Ser: 1.31 mg/dL — ABNORMAL HIGH (ref 0.44–1.00)
GFR calc Af Amer: 38 mL/min — ABNORMAL LOW (ref >60)
GFR calc non Af Amer: 33 mL/min — ABNORMAL LOW (ref >60)
GLUCOSE: 75 mg/dL (ref 65–99)
Phosphorus: 1.7 mg/dL — ABNORMAL LOW (ref 2.5–4.6)
Potassium: 4 mmol/L (ref 3.5–5.1)
Sodium: 133 mmol/L — ABNORMAL LOW (ref 135–145)

## 2018-01-22 MED ORDER — SODIUM CHLORIDE 0.9 % IV SOLN
INTRAVENOUS | Status: DC
  Administered 2018-01-22 – 2018-01-23 (×2): via INTRAVENOUS

## 2018-01-22 MED ORDER — CEPHALEXIN 250 MG PO CAPS
250.0000 mg | ORAL_CAPSULE | Freq: Two times a day (BID) | ORAL | Status: DC
  Administered 2018-01-22 – 2018-01-23 (×3): 250 mg via ORAL
  Filled 2018-01-22 (×3): qty 1

## 2018-01-22 NOTE — Clinical Social Work Placement (Signed)
   CLINICAL SOCIAL WORK PLACEMENT  NOTE  Date:  01/22/2018  Patient Details  Name: Jennifer Pittman MRN: [id number] Date of Birth: August 07, 1918  Clinical Social Work is seeking post-discharge placement for this patient at the Skilled  Nursing Facility level of care (*CSW will initial, date and re-position this form in  chart as items are completed):  Yes   Patient/family provided with [facility] Clinical Social Work Department's list of facilities offering this level of care within the geographic area requested by the patient (or if unable, by the patient's family).  Yes   Patient/family informed of their freedom to choose among providers that offer the needed level of care, that participate in Medicare, Medicaid or managed care program needed by the patient, have an available bed and are willing to accept the patient.  Yes   Patient/family informed of [facility]'s ownership interest in [facility] and [facility], as well as of the fact that they are under no obligation to receive care at these facilities.  PASRR submitted to EDS on 01/21/18     PASRR number received on 01/21/18     Existing PASRR number confirmed on       FL2 transmitted to all facilities in geographic area requested by pt/family on 01/21/18     FL2 transmitted to all facilities within larger geographic area on       Patient informed that his/her managed care company has contracts with or will negotiate with certain facilities, including the following:        Yes   Patient/family informed of bed offers received.  Patient chooses bed at [facility], [place]     Physician recommends and patient chooses bed at      Patient to be transferred to   on  .  Patient to be transferred to facility by       Patient family notified on   of transfer.  Name of family member notified:        PHYSICIAN       Additional Comment:    _______________________________________________ Antionette Poles,  LCSW 01/22/2018, 2:19 PM

## 2018-01-22 NOTE — Progress Notes (Signed)
PROGRESS NOTE  Jennifer Pittman ZOX:[id number] DOB: August 17, 1918 DOA: 01/19/2018 PCP: Georgianne Fick, MD  HPI/Recap of past 61 hours: 84 year old female with past medical history significant for hypertension, hyperlipidemia, hypothyroidism, dysphasia with esophageal strictures, CVA admitted on 01/19/2018 with complaints of fall, was found to have NSTEMI, AKI and UTI.  Patient admitted for further management.   Today, patient reported feeling okay, noted to cough while eating which has been ongoing according to son and daughter-in-law.  Denies any fever/chills, worsening shortness of breath, chest pain, nausea/vomiting/diarrhea.  Noted poor urine output since today  Assessment/Plan: Principal Problem:   Failure to thrive in adult Active Problems:   Hypothyroidism   Dehydration   Acute kidney injury superimposed on chronic kidney disease (HCC)   Dysphasia   Dysphagia   Abnormal urinalysis   Hypokalemia   Bradycardia   Protein-calorie malnutrition, severe   E. coli UTI Currently afebrile, with leukocytosis UA showed positive nitrites, many bacteria UC showed E. Coli greater than 100,000 S/P IV Rocephin, continue p.o. Keflex  AKI on CKD stage III/poor urine output Baseline creatinine around 1.58 Likely due to dehydration Will restart IV fluids as patient is not making adequate urine, with very little urine on bladder scan Daily BMP  Failure to thrive/generalized weakness/frequent falls/severe malnutrition Dietitian on board PT/OT Ensure 3 times daily with meals  Dysphagia due to esophageal stricture Refused further esophageal dilatation SLP consulted Aspiration precautions  CAD/diastolic HF Chest pain-free Flat trend troponins, EKG with no acute ST changes Echo shows EF of 45 to 50% improved from prior, no wall motion abnormalities Continue Plavix, not a candidate for further intervention due to advanced age Hold diuretics due to AKI  Orthostatic  hypotension/history of hypertension Hold home BP meds  Anemia of chronic kidney disease Stable Daily CBC  Hypothyroidism Continue Synthroid    Code Status: DNR  Family Communication: Son and daughter-in-law at bedside  Disposition Plan: SNF likely on 01/24/2018   Consultants:  None  Procedures:  None  Antimicrobials:  P.o. Keflex  DVT prophylaxis: Heparin   Objective: Vitals:   01/22/18 0608 01/22/18 0636 01/22/18 0907 01/22/18 1400  BP: (!) 129/47  (!) 134/59 (!) 143/65  Pulse: (!) 58  60 62  Resp: Temp: (!) 97.4 F (36.3 C)  (!) 97.5 F (36.4 C)   TempSrc: Oral  Oral   SpO2: 96%  99% 93%  Weight:  44.1 kg (97 lb 3.6 oz)    Height:        Intake/Output Summary (Last 24 hours) at 01/22/2018 1850 Last data filed at 01/22/2018 1702 Gross per 24 hour  Intake 400 ml  Output 400 ml  Net 0 ml   Filed Weights   01/20/18 0205 01/21/18 0431 01/22/18 0636  Weight: 43 kg (94 lb 12.8 oz) 42.8 kg (94 lb 5.7 oz) 44.1 kg (97 lb 3.6 oz)    Exam:   General: NAD  Cardiovascular: S1, S2 present  Respiratory: CTA B  Abdomen: Soft, nontender, nondistended, bowel sounds present  Musculoskeletal: No pedal edema bilaterally  Skin: Normal  Psychiatry: Normal mood   Data Reviewed: CBC: Recent Labs  Lab 01/19/18 1759 01/20/18 0211 01/21/18 0535 01/22/18 0533  WBC 9.5 7.8 14.4* 13.1*  NEUTROABS  --   --   --  10.1*  HGB 11.1* 10.5* 11.4* 10.8*  HCT 33.8* 32.2* 35.1* 33.4*  MCV 95.2 94.7 95.1 95.2  PLT 169 160 169 163   Basic Metabolic Panel: Recent Labs  Lab 01/19/18 1759 01/20/18 0211 01/21/18 0535 01/22/18 0533  NA 137 138 137 133*  K 3.2* 3.1* 4.1 4.0  CL 95* 102 104 102  CO2 21*  GLUCOSE 127* 78 92 75  BUN 52* 46* 36* 34*  CREATININE 1.83* 1.57* 1.32* 1.31*  CALCIUM 8.4* 7.8* 7.7* 7.8*  MG  --  1.3* 1.7  --   PHOS  --   --   --  1.7*   GFR: Estimated Creatinine Clearance: 16.7 mL/min (A) (by C-G formula based  on SCr of 1.31 mg/dL (H)). Liver Function Tests: Recent Labs  Lab 01/20/18 0211 01/22/18 0533  AST 16  --   ALT 10*  --   ALKPHOS 47  --   BILITOT 0.5  --   PROT 5.3*  --   ALBUMIN 2.9* 2.5*   No results for input(s): LIPASE, AMYLASE in the last 168 hours. No results for input(s): AMMONIA in the last 168 hours. Coagulation Profile: No results for input(s): INR, PROTIME in the last 168 hours. Cardiac Enzymes: Recent Labs  Lab 01/20/18 0211 01/20/18 1142 01/20/18 1617 01/20/18 2249  TROPONINI 0.46* 0.30* 0.28* 0.26*   BNP (last 3 results) No results for input(s): PROBNP in the last 8760 hours. HbA1C: No results for input(s): HGBA1C in the last 72 hours. CBG: No results for input(s): GLUCAP in the last 168 hours. Lipid Profile: No results for input(s): CHOL, HDL, LDLCALC, TRIG, CHOLHDL, LDLDIRECT in the last 72 hours. Thyroid Function Tests: No results for input(s): TSH, T4TOTAL, FREET4, T3FREE, THYROIDAB in the last 72 hours. Anemia Panel: No results for input(s): VITAMINB12, FOLATE, FERRITIN, TIBC, IRON, RETICCTPCT in the last 72 hours. Urine analysis:    Component Value Date/Time   COLORURINE YELLOW 01/19/2018 2129   APPEARANCEUR HAZY (A) 01/19/2018 2129   LABSPEC 1.017 01/19/2018 2129   PHURINE 6.0 01/19/2018 2129   GLUCOSEU NEGATIVE 01/19/2018 2129   HGBUR SMALL (A) 01/19/2018 2129   BILIRUBINUR NEGATIVE 01/19/2018 2129   KETONESUR 5 (A) 01/19/2018 2129   PROTEINUR 30 (A) 01/19/2018 2129   NITRITE POSITIVE (A) 01/19/2018 2129   LEUKOCYTESUR NEGATIVE 01/19/2018 2129   Sepsis Labs: (procalcitonin:4,lacticidven:4)  ) Recent Results (from the past 240 hour(s))  Urine culture     Status: Abnormal (Preliminary result)   Collection Time: 01/19/18  9:29 PM  Result Value Ref Range Status   Specimen Description   Final    URINE, RANDOM Performed at [facility], [address] [place], [place] [address]    Special Requests   Final     NONE Performed at [facility], [address] [place], [place] [address]    Culture (A)  Final    >=100,000 COLONIES/mL ESCHERICHIA COLI CULTURE REINCUBATED FOR BETTER GROWTH Performed at [facility], [address], [place], [place] [address]    Report Status PENDING  Incomplete   Organism ID, Bacteria ESCHERICHIA COLI (A)  Final      Susceptibility   Escherichia coli - MIC*    AMPICILLIN <=2 SENSITIVE Sensitive     CEFAZOLIN <=4 SENSITIVE Sensitive     CEFTRIAXONE <=1 SENSITIVE Sensitive     CIPROFLOXACIN <=0.25 SENSITIVE Sensitive     GENTAMICIN <=1 SENSITIVE Sensitive     IMIPENEM <=0.25 SENSITIVE Sensitive     NITROFURANTOIN <=16 SENSITIVE Sensitive     TRIMETH/SULFA <=20 SENSITIVE Sensitive     AMPICILLIN/SULBACTAM <=2 SENSITIVE Sensitive     PIP/TAZO <=4 SENSITIVE Sensitive  Extended ESBL NEGATIVE Sensitive     * >=100,000 COLONIES/mL ESCHERICHIA COLI      Studies: No results found.  Scheduled Meds: . atorvastatin  10 mg Oral q1800  . cephALEXin  250 mg Oral Q12H  . clopidogrel  75 mg Oral Daily  . dorzolamide-timolol  1 drop Both Eyes BID  . enoxaparin (LOVENOX) injection  30 mg Subcutaneous QHS  . feeding supplement (ENSURE ENLIVE)  237 mL Oral BID BM  . latanoprost  1 drop Both Eyes QHS  . levothyroxine  112 mcg Oral QAC breakfast    Continuous Infusions: . sodium chloride 75 mL/hr at 01/22/18 1614     LOS: 2 days     Briant Cedar, MD [facility]  If 7PM-7AM, please contact night-coverage [email or web address] Password [email or web address] 01/22/2018, 6:50 PM

## 2018-01-22 NOTE — Progress Notes (Signed)
Physical Therapy Treatment Patient Details Name: Jennifer Pittman MRN: [id number] DOB: March 04, 1918 Today's Date: 01/22/2018    History of Present Illness 84 y.o. female with  PMH of HTN, HLD, hypothyroidism, dysphagia with esophageal stricture, CVA,; admitted on 01/19/2018, presented with complaint of fall, was found to have non-STEMI, acute kidney injury from dehydration.    PT Comments    Pt participated well. Continue to recommend SNF .  Follow Up Recommendations  SNF     Equipment Recommendations  None recommended by PT    Recommendations for Other Services       Precautions / Restrictions Precautions Precautions: Fall Restrictions Weight Bearing Restrictions: No    Mobility  Bed Mobility Overal bed mobility: Needs Assistance Bed Mobility: Supine to Sit     Supine to sit: Supervision     General bed mobility comments: supervision for safety, no assist needed  Transfers Overall transfer level: Needs assistance Equipment used: Rolling walker (2 wheeled) Transfers: Sit to/from [employer] Sit to Stand: Min assist Stand pivot transfers: Min assist       General transfer comment: Assist to rise, stabilize. VCs safety, hand placement. Increased time.   Ambulation/Gait Ambulation/Gait assistance: Min assist Ambulation Distance (Feet): 5 Feet Assistive device: Rolling walker (2 wheeled) Gait Pattern/deviations: Trunk flexed;Step-to pattern     General Gait Details: Assist to stabilize pt and maneuver with RW. Pt took a few steps in the room. Distance limited by incontinence.    Stairs             Wheelchair Mobility    Modified Rankin (Stroke Patients Only)       Balance Overall balance assessment: Needs assistance         Standing balance support: Bilateral upper extremity supported Standing balance-Leahy Scale: Poor                              Cognition Arousal/Alertness: Awake/[place] Behavior During  Therapy: WFL for tasks assessed/performed Overall Cognitive Status: Within Functional Limits for tasks assessed                                        Exercises      General Comments        Pertinent Vitals/Pain Pain Assessment: Faces Faces Pain Scale: Hurts little more Pain Location: bunion L foot Pain Descriptors / Indicators: Tender;Sore Pain Intervention(s): Limited activity within patient's tolerance;Repositioned    Home Living                      Prior Function            PT Goals (current goals can now be found in the care plan section) Progress towards PT goals: Progressing toward goals    Frequency    Min 2X/week      PT Plan Current plan remains appropriate    Co-evaluation              AM-PAC PT "6 Clicks" Daily Activity  Outcome Measure  Difficulty turning over in bed (including adjusting bedclothes, sheets and blankets)?: A Little Difficulty moving from lying on back to sitting on the side of the bed? : A Little Difficulty sitting down on and standing up from a chair with arms (e.g., wheelchair, bedside commode, etc,.)?: Unable Help needed moving to and from a bed  to chair (including a wheelchair)?: A Little Help needed walking in hospital room?: A Little Help needed climbing 3-5 steps with a railing? : A Lot 6 Click Score: 15    End of Session Equipment Utilized During Treatment: Gait belt Activity Tolerance: Patient tolerated treatment well Patient left: in chair;with call bell/phone within reach;with chair alarm set   PT Visit Diagnosis: Difficulty in walking, not elsewhere classified (R26.2);Muscle weakness (generalized) (M62.81);History of falling (Z91.81);Repeated falls (R29.6);Other abnormalities of gait and mobility (R26.89)     Time: 1610-9604 PT Time Calculation (min) (ACUTE ONLY): 20 min  Charges:  $Therapeutic Activity: 8-22 mins                    G Codes:          Jennifer Pittman,  MPT Pager: [phone]

## 2018-01-23 DIAGNOSIS — N179 Acute kidney failure, unspecified: Secondary | ICD-10-CM | POA: Diagnosis not present

## 2018-01-23 DIAGNOSIS — R1314 Dysphagia, pharyngoesophageal phase: Secondary | ICD-10-CM | POA: Diagnosis not present

## 2018-01-23 DIAGNOSIS — E46 Unspecified protein-calorie malnutrition: Secondary | ICD-10-CM | POA: Diagnosis not present

## 2018-01-23 DIAGNOSIS — L89610 Pressure ulcer of right heel, unstageable: Secondary | ICD-10-CM | POA: Diagnosis not present

## 2018-01-23 DIAGNOSIS — L89153 Pressure ulcer of sacral region, stage 3: Secondary | ICD-10-CM | POA: Diagnosis not present

## 2018-01-23 DIAGNOSIS — L89620 Pressure ulcer of left heel, unstageable: Secondary | ICD-10-CM | POA: Diagnosis not present

## 2018-01-23 DIAGNOSIS — M6281 Muscle weakness (generalized): Secondary | ICD-10-CM | POA: Diagnosis not present

## 2018-01-23 DIAGNOSIS — E039 Hypothyroidism, unspecified: Secondary | ICD-10-CM | POA: Diagnosis not present

## 2018-01-23 DIAGNOSIS — E43 Unspecified severe protein-calorie malnutrition: Secondary | ICD-10-CM | POA: Diagnosis not present

## 2018-01-23 DIAGNOSIS — R05 Cough: Secondary | ICD-10-CM | POA: Diagnosis not present

## 2018-01-23 DIAGNOSIS — I509 Heart failure, unspecified: Secondary | ICD-10-CM | POA: Diagnosis not present

## 2018-01-23 DIAGNOSIS — R278 Other lack of coordination: Secondary | ICD-10-CM | POA: Diagnosis not present

## 2018-01-23 DIAGNOSIS — N390 Urinary tract infection, site not specified: Secondary | ICD-10-CM | POA: Diagnosis not present

## 2018-01-23 DIAGNOSIS — Z743 Need for continuous supervision: Secondary | ICD-10-CM | POA: Diagnosis not present

## 2018-01-23 DIAGNOSIS — R001 Bradycardia, unspecified: Secondary | ICD-10-CM | POA: Diagnosis not present

## 2018-01-23 DIAGNOSIS — E860 Dehydration: Secondary | ICD-10-CM | POA: Diagnosis not present

## 2018-01-23 DIAGNOSIS — R1310 Dysphagia, unspecified: Secondary | ICD-10-CM | POA: Diagnosis not present

## 2018-01-23 DIAGNOSIS — N189 Chronic kidney disease, unspecified: Secondary | ICD-10-CM | POA: Diagnosis not present

## 2018-01-23 DIAGNOSIS — R279 Unspecified lack of coordination: Secondary | ICD-10-CM | POA: Diagnosis not present

## 2018-01-23 DIAGNOSIS — R2681 Unsteadiness on feet: Secondary | ICD-10-CM | POA: Diagnosis not present

## 2018-01-23 DIAGNOSIS — R627 Adult failure to thrive: Secondary | ICD-10-CM | POA: Diagnosis not present

## 2018-01-23 DIAGNOSIS — R41841 Cognitive communication deficit: Secondary | ICD-10-CM | POA: Diagnosis not present

## 2018-01-23 LAB — RENAL FUNCTION PANEL
ANION GAP: 7 (ref 5–15)
Albumin: 2.4 g/dL — ABNORMAL LOW (ref 3.5–5.0)
BUN: 31 mg/dL — ABNORMAL HIGH (ref 6–20)
CHLORIDE: 106 mmol/L (ref 101–111)
CO2: 22 mmol/L (ref 22–32)
Calcium: 7.7 mg/dL — ABNORMAL LOW (ref 8.9–10.3)
Creatinine, Ser: 1.2 mg/dL — ABNORMAL HIGH (ref 0.44–1.00)
GFR calc non Af Amer: 36 mL/min — ABNORMAL LOW (ref >60)
GFR, EST AFRICAN AMERICAN: 42 mL/min — AB (ref >60)
Glucose, Bld: 85 mg/dL (ref 65–99)
POTASSIUM: 3.7 mmol/L (ref 3.5–5.1)
Phosphorus: 1.9 mg/dL — ABNORMAL LOW (ref 2.5–4.6)
Sodium: 135 mmol/L (ref 135–145)

## 2018-01-23 LAB — CBC WITH DIFFERENTIAL/PLATELET
Basophils Absolute: 0 10*3/uL (ref 0.0–0.1)
Basophils Relative: 0 %
Eosinophils Absolute: 0.1 10*3/uL (ref 0.0–0.7)
Eosinophils Relative: 1 %
HEMATOCRIT: 33 % — AB (ref 36.0–46.0)
HEMOGLOBIN: 10.7 g/dL — AB (ref 12.0–15.0)
LYMPHS ABS: 2.1 10*3/uL (ref 0.7–4.0)
Lymphocytes Relative: 22 %
MCH: 30.9 pg (ref 26.0–34.0)
MCHC: 32.4 g/dL (ref 30.0–36.0)
MCV: 95.4 fL (ref 78.0–100.0)
MONOS PCT: 5 %
Monocytes Absolute: 0.5 10*3/uL (ref 0.1–1.0)
NEUTROS PCT: 72 %
Neutro Abs: 6.8 10*3/uL (ref 1.7–7.7)
Platelets: 178 10*3/uL (ref 150–400)
RBC: 3.46 MIL/uL — ABNORMAL LOW (ref 3.87–5.11)
RDW: 14 % (ref 11.5–15.5)
WBC: 9.5 10*3/uL (ref 4.0–10.5)

## 2018-01-23 MED ORDER — CEPHALEXIN 250 MG PO CAPS: 250.0000 mg | ORAL_CAPSULE | Freq: Two times a day (BID) | ORAL | 0 refills | Status: AC

## 2018-01-23 NOTE — Discharge Summary (Signed)
Discharge Summary  Jennifer Pittman ZOX:[id number] DOB: 11-16-1917  PCP: Georgianne Fick, MD  Admit date: 01/19/2018 Discharge date: 01/23/2018  Time spent: 40 mins  Recommendations for Outpatient Follow-up:  1. PCP  Discharge Diagnoses:  Active Hospital Problems   Diagnosis Date Noted  . Failure to thrive in adult 01/20/2018  . Protein-calorie malnutrition, severe 01/21/2018  . Dehydration 01/20/2018  . Acute kidney injury superimposed on chronic kidney disease (HCC) 01/20/2018  . Dysphasia 01/20/2018  . Dysphagia 01/20/2018  . Abnormal urinalysis 01/20/2018  . Hypokalemia 01/20/2018  . Bradycardia 01/20/2018  . Hypothyroidism 10/26/2015    Resolved Hospital Problems  No resolved problems to display.    Discharge Condition: Stable  Diet recommendation: Regular  Vitals:   01/22/18 2154 01/23/18 0532  BP: (!) 143/58   Pulse: (!) 53   Resp: 18 18  Temp: 97.7 F (36.5 C) (!) 97.5 F (36.4 C)  SpO2: 98% 96%    History of present illness:  84 year old female with past medical history significant for hypertension, hyperlipidemia, hypothyroidism, dysphasia with esophageal strictures, CVA admitted on 01/19/2018 with complaints of fall, was found to have NSTEMI, AKI and UTI.  Patient admitted for further management.   Today, patient reported feeling better. Improved urine output s/p IVF. Denies any fever/chills, worsening shortness of breath, chest pain, nausea/vomiting/diarrhea. Pt stable to be transferred to SNF.   Hospital Course:  Principal Problem:   Failure to thrive in adult Active Problems:   Hypothyroidism   Dehydration   Acute kidney injury superimposed on chronic kidney disease (HCC)   Dysphasia   Dysphagia   Abnormal urinalysis   Hypokalemia   Bradycardia   Protein-calorie malnutrition, severe  E. coli UTI Currently afebrile, with resolved leukocytosis UA showed positive nitrites, many bacteria UC showed E. Coli greater than 100,000 S/P  IV Rocephin, continue p.o. Keflex till 01/25/18  AKI on CKD stage III/poor urine output Baseline creatinine around 1.58, now 1.2 Likely due to dehydration Patient is at high risk for dehydration and oral intake and urine output should be monitored very closely  HTN/orthostatic hypotension BP has been stable off BP meds May continue amlodipine if BP uncontrolled, and avoid any hypotensive episode Discontinued home telmisartan and HCT   Failure to thrive/generalized weakness/frequent falls/severe malnutrition SLP recommneded dysphagia 3 (Mech soft) PT/OT Ensure 3 times daily with meals  Dysphagia due to esophageal stricture Refused further esophageal dilatation SLP consulted as above Aspiration precautions  CAD/diastolic and systolic CHF Chest pain-free Flat trend troponins, EKG with no acute ST changes Echo shows EF of 45 to 50% improved from prior, no wall motion abnormalities Continue Plavix, not a candidate for further intervention due to advanced age  Anemia of chronic kidney disease Stable  Hypothyroidism Continue Synthroid     Procedures:  None   Consultations:  None  Discharge Exam: BP (!) 143/58 (BP Location: Left Arm)   Pulse (!) 53   Temp (!) 97.5 F (36.4 C) (Oral)   Resp 18   Ht  (1.626 m)   Wt 46.1 kg (101 lb 10.1 oz)   SpO2 96%   BMI 17.45 kg/m   General: NAD  Cardiovascular: S1, S2 present Respiratory: CTAB  Discharge Instructions You were cared for by a hospitalist during your hospital stay. If you have any questions about your discharge medications or the care you received while you were in the hospital after you are discharged, you can call the unit and asked to speak with the hospitalist on call if  the hospitalist that took care of you is not available. Once you are discharged, your primary care physician will handle any further medical issues. Please note that NO REFILLS for any discharge medications will be authorized once you  are discharged, as it is imperative that you return to your primary care physician (or establish a relationship with a primary care physician if you do not have one) for your aftercare needs so that they can reassess your need for medications and monitor your lab values.  Discharge Instructions    Diet - low sodium heart healthy   Complete by:  As directed    Increase activity slowly   Complete by:  As directed      Allergies as of 01/23/2018   No Known Allergies     Medication List    STOP taking these medications   hydrochlorothiazide 50 MG tablet Commonly known as:  HYDRODIURIL   telmisartan 20 MG tablet Commonly known as:  MICARDIS     TAKE these medications   amLODipine 10 MG tablet Commonly known as:  NORVASC Take 10 mg by mouth daily.   atorvastatin 10 MG tablet Commonly known as:  LIPITOR Take 1 tablet (10 mg total) by mouth daily at 6 PM.   B-12 IJ Inject 1,000 mcg as directed every 30 (thirty) days.   cephALEXin 250 MG capsule Commonly known as:  KEFLEX Take 1 capsule (250 mg total) by mouth every 12 (twelve) hours for 5 doses.   clopidogrel 75 MG tablet Commonly known as:  PLAVIX Take 1 tablet (75 mg total) by mouth daily.   dorzolamide-timolol 22.3-6.8 MG/ML ophthalmic solution Commonly known as:  COSOPT Place 1 drop into both eyes 2 (two) times daily.   latanoprost 0.005 % ophthalmic solution Commonly known as:  XALATAN Place 1 drop into both eyes at bedtime.   levothyroxine 112 MCG tablet Commonly known as:  SYNTHROID, LEVOTHROID Take 112 mcg by mouth daily before breakfast.   multivitamin with minerals Tabs tablet Take 1 tablet by mouth daily.   Vitamin D 2000 units tablet Take 2,000 Units by mouth daily.      No Known Allergies Follow-up Information    Georgianne Fick, MD. Schedule an appointment as soon as possible for a visit in 1 week(s).   Specialty:  Internal Medicine Contact information: [address] [place]  [address] [place] [address] [phone]            The results of significant diagnostics from this hospitalization (including imaging, microbiology, ancillary and laboratory) are listed below for reference.    Significant Diagnostic Studies: Dg Chest 2 View  Result Date: 01/19/2018 CLINICAL DATA:  Weakness EXAM: CHEST - 2 VIEW COMPARISON:  10/26/2015 FINDINGS: Mild hyperinflation. Numerous leads and wires project over the chest. Midline trachea. Mild cardiomegaly. Atherosclerosis in the transverse aorta. Tortuous thoracic aorta. No pleural effusion or pneumothorax. No congestive failure. Clear lungs. IMPRESSION: No acute cardiopulmonary disease. Cardiomegaly without congestive failure. Aortic Atherosclerosis (ICD10-I70.0). Hyperinflation. Electronically Signed   By: Jeronimo Greaves M.D.   On: 01/19/2018 18:45    Microbiology: Recent Results (from the past 240 hour(s))  Urine culture     Status: Abnormal (Preliminary result)   Collection Time: 01/19/18  9:29 PM  Result Value Ref Range Status   Specimen Description   Final    URINE, RANDOM Performed at [facility], [address], [place], [place] [address]    Special Requests   Final    NONE Performed at [facility], [address], [place], [place] [address]    Culture (A)  Final    >=100,000 COLONIES/mL ESCHERICHIA COLI CULTURE REINCUBATED FOR BETTER GROWTH Performed at [facility], [address] [place], [place] [address]    Report Status PENDING  Incomplete   Organism ID, Bacteria ESCHERICHIA COLI (A)  Final      Susceptibility   Escherichia coli - MIC*    AMPICILLIN <=2 SENSITIVE Sensitive     CEFAZOLIN <=4 SENSITIVE Sensitive     CEFTRIAXONE <=1 SENSITIVE Sensitive     CIPROFLOXACIN <=0.25 SENSITIVE Sensitive     GENTAMICIN <=1 SENSITIVE Sensitive     IMIPENEM <=0.25 SENSITIVE Sensitive     NITROFURANTOIN <=16 SENSITIVE Sensitive     TRIMETH/SULFA <=20 SENSITIVE  Sensitive     AMPICILLIN/SULBACTAM <=2 SENSITIVE Sensitive     PIP/TAZO <=4 SENSITIVE Sensitive     Extended ESBL NEGATIVE Sensitive     * >=100,000 COLONIES/mL ESCHERICHIA COLI     Labs: Basic Metabolic Panel: Recent Labs  Lab 01/19/18 1759 01/20/18 0211 01/21/18 0535 01/22/18 0533 01/23/18 0545  NA 137 138 137 133* 135  K 3.2* 3.1* 4.1 4.0 3.7  CL 95* 102 104 102 106  CO2 21* 22  GLUCOSE 127* 78 92 75 85  BUN 52* 46* 36* 34* 31*  CREATININE 1.83* 1.57* 1.32* 1.31* 1.20*  CALCIUM 8.4* 7.8* 7.7* 7.8* 7.7*  MG  --  1.3* 1.7  --   --   PHOS  --   --   --  1.7* 1.9*   Liver Function Tests: Recent Labs  Lab 01/20/18 0211 01/22/18 0533 01/23/18 0545  AST 16  --   --   ALT 10*  --   --   ALKPHOS 47  --   --   BILITOT 0.5  --   --   PROT 5.3*  --   --   ALBUMIN 2.9* 2.5* 2.4*   No results for input(s): LIPASE, AMYLASE in the last 168 hours. No results for input(s): AMMONIA in the last 168 hours. CBC: Recent Labs  Lab 01/19/18 1759 01/20/18 0211 01/21/18 0535 01/22/18 0533 01/23/18 0545  WBC 9.5 7.8 14.4* 13.1* 9.5  NEUTROABS  --   --   --  10.1* 6.8  HGB 11.1* 10.5* 11.4* 10.8* 10.7*  HCT 33.8* 32.2* 35.1* 33.4* 33.0*  MCV 95.2 94.7 95.1 95.2 95.4  PLT 169 160 169 163 178   Cardiac Enzymes: Recent Labs  Lab 01/20/18 0211 01/20/18 1142 01/20/18 1617 01/20/18 2249  TROPONINI 0.46* 0.30* 0.28* 0.26*   BNP: BNP (last 3 results) No results for input(s): BNP in the last 8760 hours.  ProBNP (last 3 results) No results for input(s): PROBNP in the last 8760 hours.  CBG: No results for input(s): GLUCAP in the last 168 hours.     Signed:  Briant Cedar, MD [facility] 01/23/2018, 1:42 PM

## 2018-01-23 NOTE — Care Management Important Message (Signed)
Important Message  Patient Details  Name: Kendelle Schweers MRN: [id number] Date of Birth: [birth year]   Medicare Important Message Given:  Yes    Caren Macadam 01/23/2018, 10:43 AMImportant Message  Patient Details  Name: Amilia Vandenbrink MRN: [id number] Date of Birth: [birth year]   Medicare Important Message Given:  Yes    Caren Macadam 01/23/2018, 10:43 AM

## 2018-01-23 NOTE — Care Management Note (Signed)
Case Management Note  Patient Details  Name: Jennifer Pittman MRN: [id number] Date of Birth: 12/19/1917  Subjective/Objective:                    Action/Plan:d/c SNF.   Expected Discharge Date:  01/23/18               Expected Discharge Plan:  Skilled Nursing Facility  In-House Referral:  Clinical Social Work  Discharge planning Services  CM Consult  Post Acute Care Choice:    Choice offered to:     DME Arranged:    DME Agency:     HH Arranged:    HH Agency:     Status of Service:  Completed, signed off  If discussed at [employer], dates discussed:    Additional Comments:  Lanier Clam, RN 01/23/2018, 1:58 PM

## 2018-01-23 NOTE — Evaluation (Signed)
Clinical/Bedside Swallow Evaluation Patient Details  Name: Jennifer Pittman MRN: [id number] Date of Birth: 06/22/1918  Today's Date: 01/23/2018 Time: SLP Start Time (ACUTE ONLY): 1115 SLP Stop Time (ACUTE ONLY): 1135 SLP Time Calculation (min) (ACUTE ONLY): 20 min  Past Medical History:  Past Medical History:  Diagnosis Date  . B12 deficiency 10/26/2015  . Bundle branch block, left   . CKD (chronic kidney disease), stage IV (HCC) 10/26/2015  . Hypertension   . Hypothyroidism 10/26/2015  . Osteoarthritis   . Pernicious anemia   . Stroke (HCC)   . Thyroid disease    Past Surgical History:  Past Surgical History:  Procedure Laterality Date  . ABDOMINAL HYSTERECTOMY    . hyterectomy    . TOTAL HIP ARTHROPLASTY Left    HPI:  84 year old female admitted 01/19/18 due to failure to thrive. PMH: chronic dysphagia,    Assessment / Plan / Recommendation Clinical Impression  Pt continues to present with s/s consistent with an esophageal dysphagia. Pt and family report globus sensation, multiple swallows required with each food bolus, tiny bites and sips are required. Daughter in law indicates pt requires 2- 2.5 hours per meal.  Pt exhibited intermittent congested, nonproductive cough during exam. Oral motor function appears WFL.  Discussed swallow with pt, son and dtr-in-law, who report this is a chronic issue, and is no worse or better than it has been. Pt/family continue to decline completion of MBS or barium swallow. Family indicates a "routine" which they have followed at home, and pt is aware of what she can manage and what she cannot. Will continue current diet, encourage supplements given poor intake.  No further ST intervention is recommended at this time.     SLP Visit Diagnosis: Dysphagia, unspecified (R13.10)    Aspiration Risk  Moderate aspiration risk    Diet Recommendation Dysphagia 3 (Mech soft);Thin liquid   Liquid Administration via: Cup;Straw Medication Administration: (as  tolerated) Supervision: Patient able to self feed Compensations: Minimize environmental distractions;Slow rate;Small sips/bites Postural Changes: Remain upright for at least 30 minutes after po intake;Seated upright at 90 degrees    Other  Recommendations Oral Care Recommendations: Oral care QID   Follow up Recommendations None          Prognosis Prognosis for Safe Diet Advancement: Fair      Swallow Study   General Date of Onset: 01/19/18 HPI: 84 year old female admitted 01/19/18 due to failure to thrive. PMH: chronic dysphagia,  Type of Study: Bedside Swallow Evaluation Previous Swallow Assessment: BSE February 2019, pt at baseline for swallow. Pt/family declined MBS and Barium Swallow Diet Prior to this Study: Dysphagia 3 (soft);Thin liquids Temperature Spikes Noted: No Respiratory Status: Room air History of Recent Intubation: No Behavior/Cognition: Alert;Pleasant mood;Cooperative Oral Cavity Assessment: Within Functional Limits Oral Care Completed by SLP: No Oral Cavity - Dentition: Adequate natural dentition Vision: Functional for self-feeding Self-Feeding Abilities: Able to feed self Patient Positioning: Upright in bed Baseline Vocal Quality: Normal Volitional Cough: Congested Volitional Swallow: Able to elicit    Oral/Motor/Sensory Function Overall Oral Motor/Sensory Function: Within functional limits   Ice Chips Ice chips: Not tested   Thin Liquid Thin Liquid: Within functional limits Presentation: Straw    Nectar Thick Nectar Thick Liquid: Not tested   Honey Thick Honey Thick Liquid: Not tested   Puree Puree: Impaired Presentation: Self Fed;Spoon Pharyngeal Phase Impairments: Cough - Delayed   Solid   GO   Solid: Impaired Presentation: Self Fed Pharyngeal Phase Impairments: Cough -  Delayed       Jennifer Pittman, [facility], CCC-SLP Speech Language Pathologist [phone]  Jennifer Pittman 01/23/2018,11:37 AM

## 2018-01-23 NOTE — Clinical Social Work Placement (Signed)
Patient received and accepted bed offer at [facility]. Facility aware of patient's discharge and confirmed bed offer. PTAR contacted, patient's family notified. Patient's RN can call report to [phone] Room 208, packet complete. CSW signing off, no other needs identified at this time.  CLINICAL SOCIAL WORK PLACEMENT  NOTE  Date:  01/23/2018  Patient Details  Name: Jennifer Pittman MRN: [id number] Date of Birth: February 06, 1918  Clinical Social Work is seeking post-discharge placement for this patient at the Skilled  Nursing Facility level of care (*CSW will initial, date and re-position this form in  chart as items are completed):  Yes   Patient/family provided with [place] Clinical Social Work Department's list of facilities offering this level of care within the geographic area requested by the patient (or if unable, by the patient's family).  Yes   Patient/family informed of their freedom to choose among providers that offer the needed level of care, that participate in Medicare, Medicaid or managed care program needed by the patient, have an available bed and are willing to accept the patient.  Yes   Patient/family informed of [facility]'s ownership interest in [facility] and [facility], as well as of the fact that they are under no obligation to receive care at these facilities.  PASRR submitted to EDS on 01/21/18     PASRR number received on 01/21/18     Existing PASRR number confirmed on       FL2 transmitted to all facilities in geographic area requested by pt/family on 01/21/18     FL2 transmitted to all facilities within larger geographic area on       Patient informed that his/her managed care company has contracts with or will negotiate with certain facilities, including the following:        Yes   Patient/family informed of bed offers received.  Patient chooses bed at [facility] [place]     Physician recommends and patient chooses bed at        Patient to be transferred to [facility] [place] on 01/23/18.  Patient to be transferred to facility by PTAR     Patient family notified on 01/23/18 of transfer.  Name of family member notified:  Lolita Lenz     PHYSICIAN       Additional Comment:    _______________________________________________ Antionette Poles, LCSW 01/23/2018, 2:27 PM

## 2018-01-24 LAB — URINE CULTURE: Culture: >=100000 — AB

## 2018-01-25 DIAGNOSIS — E860 Dehydration: Secondary | ICD-10-CM | POA: Diagnosis not present

## 2018-01-25 DIAGNOSIS — E46 Unspecified protein-calorie malnutrition: Secondary | ICD-10-CM | POA: Diagnosis not present

## 2018-01-25 DIAGNOSIS — R627 Adult failure to thrive: Secondary | ICD-10-CM | POA: Diagnosis not present

## 2018-01-25 DIAGNOSIS — R001 Bradycardia, unspecified: Secondary | ICD-10-CM | POA: Diagnosis not present

## 2018-02-04 DIAGNOSIS — L89620 Pressure ulcer of left heel, unstageable: Secondary | ICD-10-CM | POA: Diagnosis not present

## 2018-02-04 DIAGNOSIS — L89153 Pressure ulcer of sacral region, stage 3: Secondary | ICD-10-CM | POA: Diagnosis not present

## 2018-02-04 DIAGNOSIS — L89610 Pressure ulcer of right heel, unstageable: Secondary | ICD-10-CM | POA: Diagnosis not present

## 2018-02-06 DIAGNOSIS — I509 Heart failure, unspecified: Secondary | ICD-10-CM | POA: Diagnosis not present

## 2018-02-16 DIAGNOSIS — M109 Gout, unspecified: Secondary | ICD-10-CM | POA: Diagnosis not present

## 2018-02-16 DIAGNOSIS — M25531 Pain in right wrist: Secondary | ICD-10-CM | POA: Diagnosis not present

## 2018-02-16 DIAGNOSIS — I509 Heart failure, unspecified: Secondary | ICD-10-CM | POA: Diagnosis not present

## 2018-02-16 DIAGNOSIS — R7989 Other specified abnormal findings of blood chemistry: Secondary | ICD-10-CM | POA: Diagnosis not present

## 2018-02-17 DIAGNOSIS — M25531 Pain in right wrist: Secondary | ICD-10-CM | POA: Diagnosis not present

## 2018-02-18 DIAGNOSIS — L89153 Pressure ulcer of sacral region, stage 3: Secondary | ICD-10-CM | POA: Diagnosis not present

## 2018-02-18 DIAGNOSIS — L89620 Pressure ulcer of left heel, unstageable: Secondary | ICD-10-CM | POA: Diagnosis not present

## 2018-02-18 DIAGNOSIS — L89610 Pressure ulcer of right heel, unstageable: Secondary | ICD-10-CM | POA: Diagnosis not present

## 2018-02-25 DIAGNOSIS — L89610 Pressure ulcer of right heel, unstageable: Secondary | ICD-10-CM | POA: Diagnosis not present

## 2018-02-25 DIAGNOSIS — L89153 Pressure ulcer of sacral region, stage 3: Secondary | ICD-10-CM | POA: Diagnosis not present

## 2018-02-25 DIAGNOSIS — L89620 Pressure ulcer of left heel, unstageable: Secondary | ICD-10-CM | POA: Diagnosis not present

## 2018-03-04 DIAGNOSIS — L89620 Pressure ulcer of left heel, unstageable: Secondary | ICD-10-CM | POA: Diagnosis not present

## 2018-03-04 DIAGNOSIS — L89153 Pressure ulcer of sacral region, stage 3: Secondary | ICD-10-CM | POA: Diagnosis not present

## 2018-03-04 DIAGNOSIS — L89610 Pressure ulcer of right heel, unstageable: Secondary | ICD-10-CM | POA: Diagnosis not present

## 2018-03-11 DIAGNOSIS — L89153 Pressure ulcer of sacral region, stage 3: Secondary | ICD-10-CM | POA: Diagnosis not present

## 2018-03-18 DIAGNOSIS — L89153 Pressure ulcer of sacral region, stage 3: Secondary | ICD-10-CM | POA: Diagnosis not present

## 2018-03-25 DIAGNOSIS — R946 Abnormal results of thyroid function studies: Secondary | ICD-10-CM | POA: Diagnosis not present

## 2018-03-25 DIAGNOSIS — L89153 Pressure ulcer of sacral region, stage 3: Secondary | ICD-10-CM | POA: Diagnosis not present

## 2018-03-26 DIAGNOSIS — E039 Hypothyroidism, unspecified: Secondary | ICD-10-CM | POA: Diagnosis not present

## 2018-04-01 DIAGNOSIS — L89153 Pressure ulcer of sacral region, stage 3: Secondary | ICD-10-CM | POA: Diagnosis not present

## 2018-04-08 DIAGNOSIS — R946 Abnormal results of thyroid function studies: Secondary | ICD-10-CM | POA: Diagnosis not present

## 2018-04-08 DIAGNOSIS — L98499 Non-pressure chronic ulcer of skin of other sites with unspecified severity: Secondary | ICD-10-CM | POA: Diagnosis not present

## 2018-04-08 DIAGNOSIS — L89153 Pressure ulcer of sacral region, stage 3: Secondary | ICD-10-CM | POA: Diagnosis not present

## 2018-04-15 DIAGNOSIS — L89153 Pressure ulcer of sacral region, stage 3: Secondary | ICD-10-CM | POA: Diagnosis not present

## 2018-04-22 DIAGNOSIS — L89153 Pressure ulcer of sacral region, stage 3: Secondary | ICD-10-CM | POA: Diagnosis not present

## 2018-04-23 DIAGNOSIS — T8130XD Disruption of wound, unspecified, subsequent encounter: Secondary | ICD-10-CM | POA: Diagnosis not present

## 2018-04-25 DIAGNOSIS — E039 Hypothyroidism, unspecified: Secondary | ICD-10-CM | POA: Diagnosis not present

## 2018-04-25 DIAGNOSIS — Z79899 Other long term (current) drug therapy: Secondary | ICD-10-CM | POA: Diagnosis not present

## 2018-04-29 DIAGNOSIS — L89153 Pressure ulcer of sacral region, stage 3: Secondary | ICD-10-CM | POA: Diagnosis not present

## 2018-05-06 DIAGNOSIS — L89153 Pressure ulcer of sacral region, stage 3: Secondary | ICD-10-CM | POA: Diagnosis not present

## 2018-05-09 DIAGNOSIS — E038 Other specified hypothyroidism: Secondary | ICD-10-CM | POA: Diagnosis not present

## 2018-05-09 DIAGNOSIS — E039 Hypothyroidism, unspecified: Secondary | ICD-10-CM | POA: Diagnosis not present

## 2018-05-13 DIAGNOSIS — L89153 Pressure ulcer of sacral region, stage 3: Secondary | ICD-10-CM | POA: Diagnosis not present

## 2018-05-27 DIAGNOSIS — E43 Unspecified severe protein-calorie malnutrition: Secondary | ICD-10-CM | POA: Diagnosis not present

## 2018-05-27 DIAGNOSIS — R1314 Dysphagia, pharyngoesophageal phase: Secondary | ICD-10-CM | POA: Diagnosis not present

## 2018-05-27 DIAGNOSIS — R4702 Dysphasia: Secondary | ICD-10-CM | POA: Diagnosis not present

## 2018-05-27 DIAGNOSIS — N390 Urinary tract infection, site not specified: Secondary | ICD-10-CM | POA: Diagnosis not present

## 2018-05-28 DIAGNOSIS — R4702 Dysphasia: Secondary | ICD-10-CM | POA: Diagnosis not present

## 2018-05-28 DIAGNOSIS — N390 Urinary tract infection, site not specified: Secondary | ICD-10-CM | POA: Diagnosis not present

## 2018-05-28 DIAGNOSIS — R1314 Dysphagia, pharyngoesophageal phase: Secondary | ICD-10-CM | POA: Diagnosis not present

## 2018-05-28 DIAGNOSIS — E43 Unspecified severe protein-calorie malnutrition: Secondary | ICD-10-CM | POA: Diagnosis not present

## 2018-06-02 DIAGNOSIS — E02 Subclinical iodine-deficiency hypothyroidism: Secondary | ICD-10-CM | POA: Diagnosis not present

## 2018-06-02 DIAGNOSIS — E039 Hypothyroidism, unspecified: Secondary | ICD-10-CM | POA: Diagnosis not present

## 2018-06-03 DIAGNOSIS — L89322 Pressure ulcer of left buttock, stage 2: Secondary | ICD-10-CM | POA: Diagnosis not present

## 2018-06-09 DIAGNOSIS — E039 Hypothyroidism, unspecified: Secondary | ICD-10-CM | POA: Diagnosis not present

## 2018-06-09 DIAGNOSIS — Z79899 Other long term (current) drug therapy: Secondary | ICD-10-CM | POA: Diagnosis not present

## 2018-06-10 DIAGNOSIS — L89322 Pressure ulcer of left buttock, stage 2: Secondary | ICD-10-CM | POA: Diagnosis not present

## 2018-06-11 DIAGNOSIS — R4702 Dysphasia: Secondary | ICD-10-CM | POA: Diagnosis not present

## 2018-06-11 DIAGNOSIS — N390 Urinary tract infection, site not specified: Secondary | ICD-10-CM | POA: Diagnosis not present

## 2018-06-11 DIAGNOSIS — E43 Unspecified severe protein-calorie malnutrition: Secondary | ICD-10-CM | POA: Diagnosis not present

## 2018-06-11 DIAGNOSIS — R1314 Dysphagia, pharyngoesophageal phase: Secondary | ICD-10-CM | POA: Diagnosis not present

## 2018-06-15 DIAGNOSIS — H109 Unspecified conjunctivitis: Secondary | ICD-10-CM | POA: Diagnosis not present

## 2018-06-15 DIAGNOSIS — R946 Abnormal results of thyroid function studies: Secondary | ICD-10-CM | POA: Diagnosis not present

## 2018-06-15 DIAGNOSIS — L03211 Cellulitis of face: Secondary | ICD-10-CM | POA: Diagnosis not present

## 2018-06-19 DIAGNOSIS — R1310 Dysphagia, unspecified: Secondary | ICD-10-CM | POA: Diagnosis not present

## 2018-06-19 DIAGNOSIS — R1314 Dysphagia, pharyngoesophageal phase: Secondary | ICD-10-CM | POA: Diagnosis not present

## 2018-06-19 DIAGNOSIS — N390 Urinary tract infection, site not specified: Secondary | ICD-10-CM | POA: Diagnosis not present

## 2018-06-19 DIAGNOSIS — R4702 Dysphasia: Secondary | ICD-10-CM | POA: Diagnosis not present

## 2018-06-24 DIAGNOSIS — N390 Urinary tract infection, site not specified: Secondary | ICD-10-CM | POA: Diagnosis not present

## 2018-06-24 DIAGNOSIS — R4702 Dysphasia: Secondary | ICD-10-CM | POA: Diagnosis not present

## 2018-06-24 DIAGNOSIS — R1314 Dysphagia, pharyngoesophageal phase: Secondary | ICD-10-CM | POA: Diagnosis not present

## 2018-06-24 DIAGNOSIS — R1310 Dysphagia, unspecified: Secondary | ICD-10-CM | POA: Diagnosis not present

## 2018-07-21 DIAGNOSIS — E039 Hypothyroidism, unspecified: Secondary | ICD-10-CM | POA: Diagnosis not present

## 2018-07-21 DIAGNOSIS — Z5181 Encounter for therapeutic drug level monitoring: Secondary | ICD-10-CM | POA: Diagnosis not present

## 2018-08-27 DEATH — deceased

## 2019-07-07 IMAGING — MR MR MRA HEAD W/O CM
9 of 11 series · 30 of 48 positions shown · non-contrast
Comparison: 09/26/2017 CT head

CLINICAL DATA: [AGE]/o F; acute numbness and weakness involving the
right lower extremity.

EXAM:
MRI HEAD WITHOUT CONTRAST
MRA HEAD WITHOUT CONTRAST
TECHNIQUE: Multiplanar, multiecho pulse sequences of the brain and surrounding
structures were obtained without intravenous contrast. Angiographic
images of the head were obtained using MRA technique without
contrast.

[Series 3: DWI · axial · 3.0mm · 0.94mm/px · z∈[-76,+70]mm · 7 of 100 slices shown (1 of 2)]
[im 1/100]
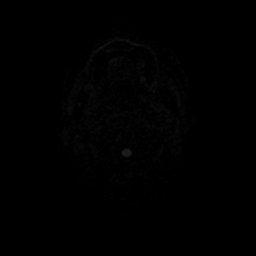
[im 17/100]
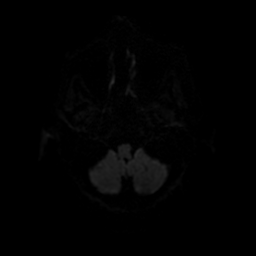
[im 34/100]
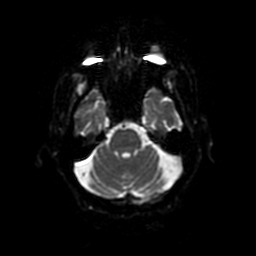
[im 50/100]
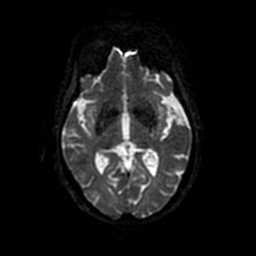
[im 67/100]
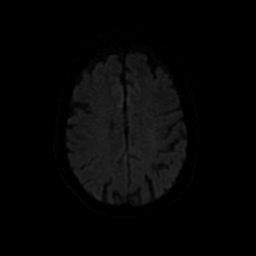
[im 83/100]
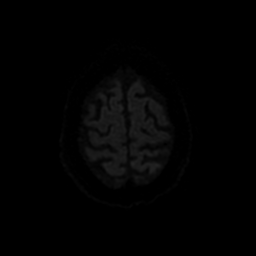
[im 100/100]
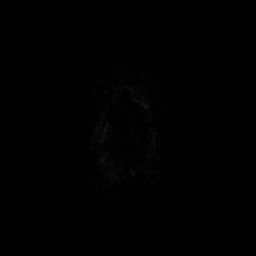

[Series 4: ax (id) 2 · axial · 1.0mm · 0.43mm/px · z∈[-52,+1]mm · 5 of 184 slices shown]
[im 1/184]
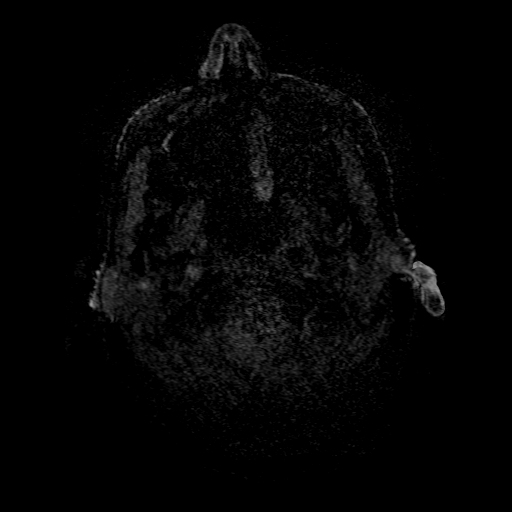
[im 31/184]
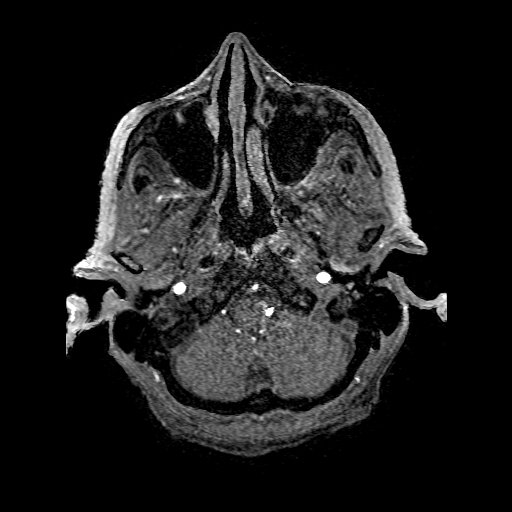
[im 62/184]
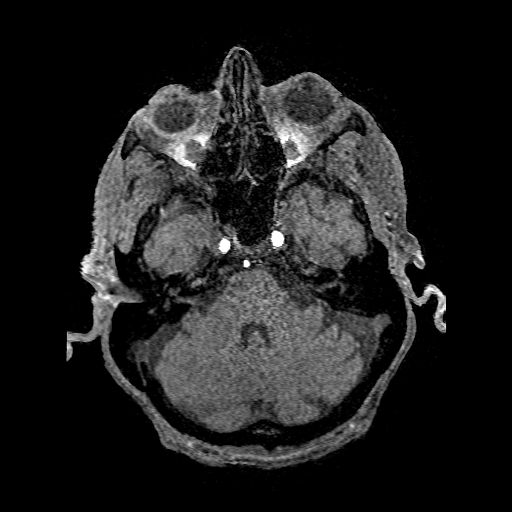
[im 77/184]
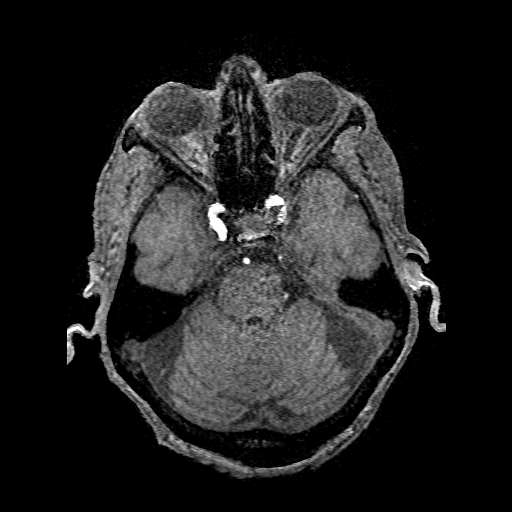
[im 107/184]
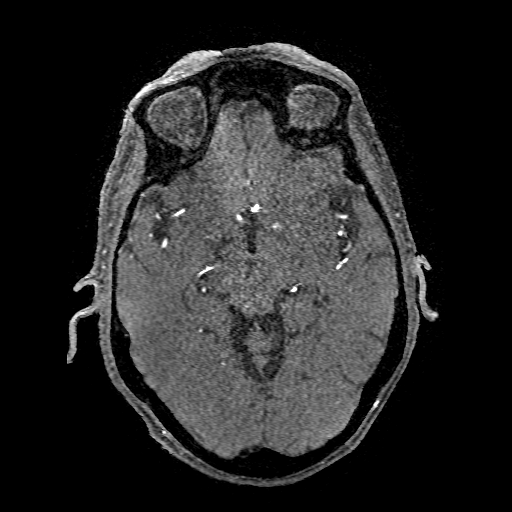

[Series 5: DWI · coronal · 4.0mm · 0.94mm/px · 5 of 68 slices shown (2 of 2)]
[im 1/68]
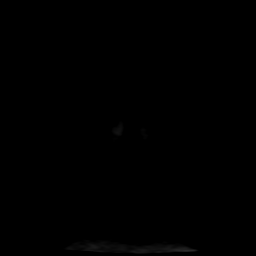
[im 17/68]
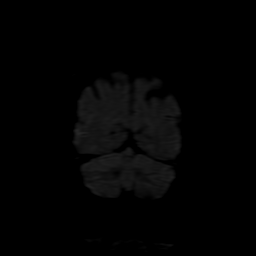
[im 34/68]
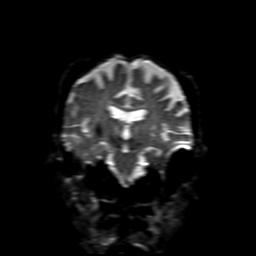
[im 51/68]
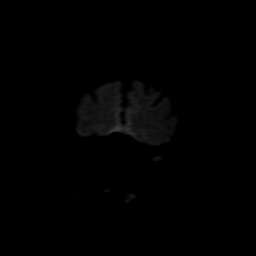
[im 68/68]
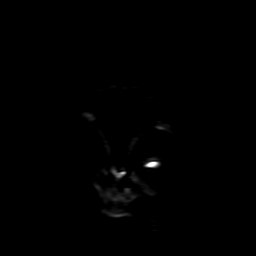

[Series 6: FLAIR · sagittal · 5.0mm · 0.47mm/px · 2 of 23 slices shown (1 of 2)]
[im 1/23]
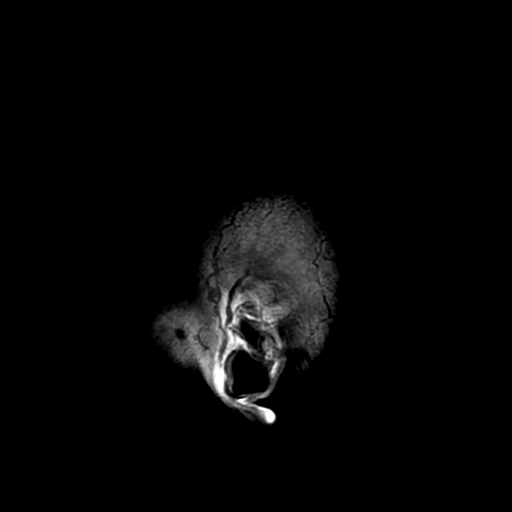
[im 23/23]
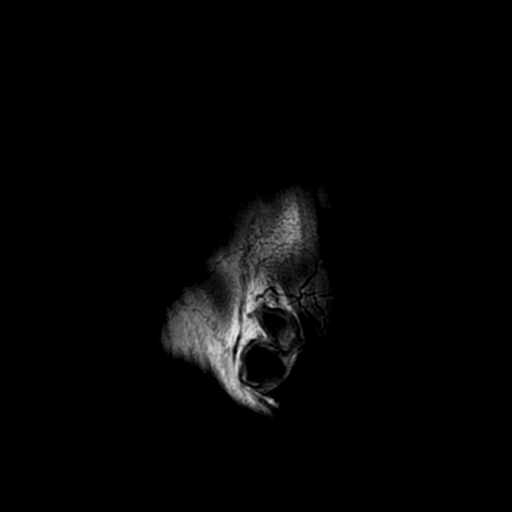

[Series 7: T2 · axial · 5.0mm · 0.43mm/px · z∈[-75,+69]mm · 2 of 25 slices shown (1 of 2)]
[im 1/25]
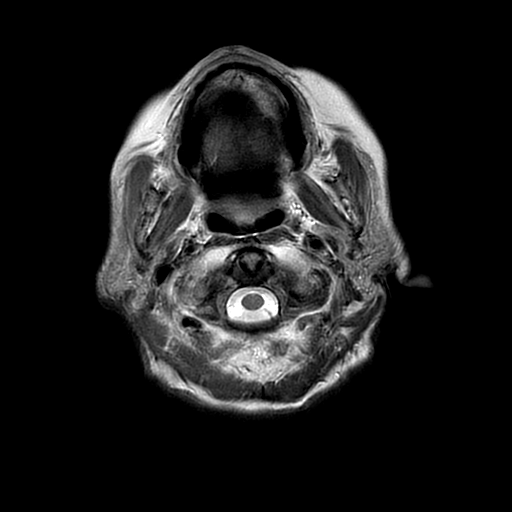
[im 25/25]
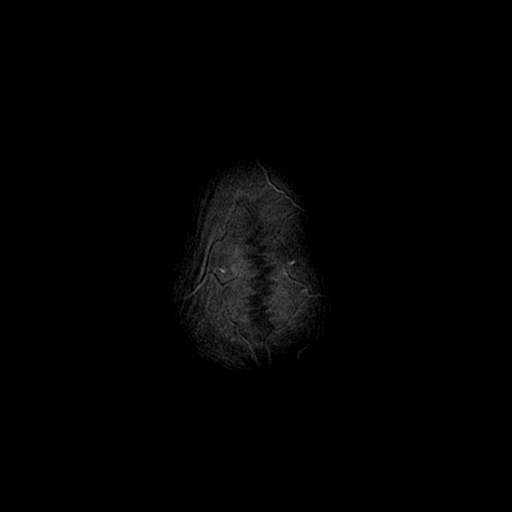

[Series 8: FLAIR · axial · 3.0mm · 0.43mm/px · z∈[-75,+69]mm · 2 of 25 slices shown (2 of 2)]
[im 1/25]
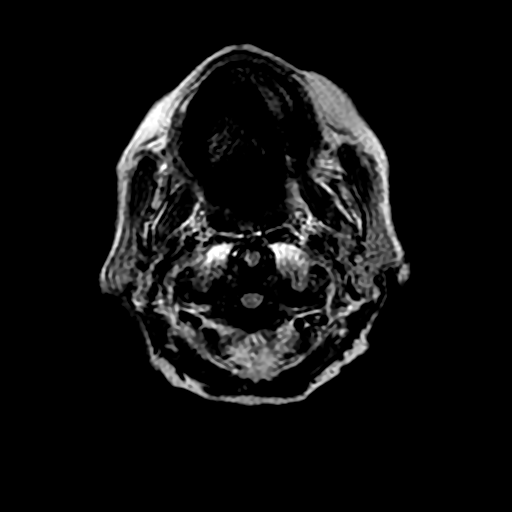
[im 25/25]
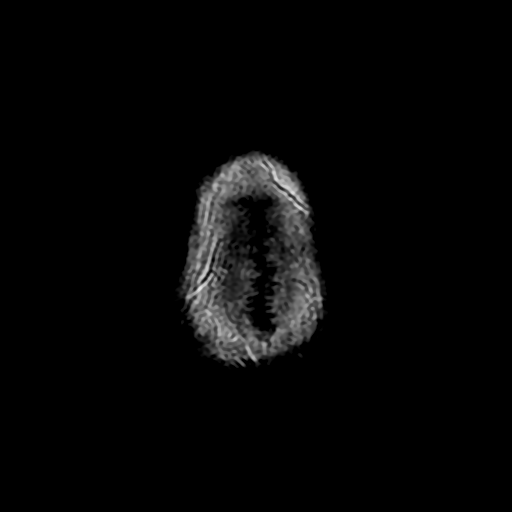

[Series 11: T2 · coronal · 5.0mm · 0.39mm/px · 2 of 28 slices shown (2 of 2)]
[im 1/28]
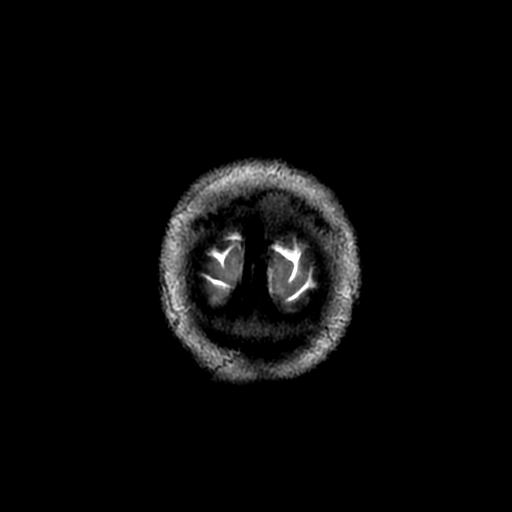
[im 28/28]
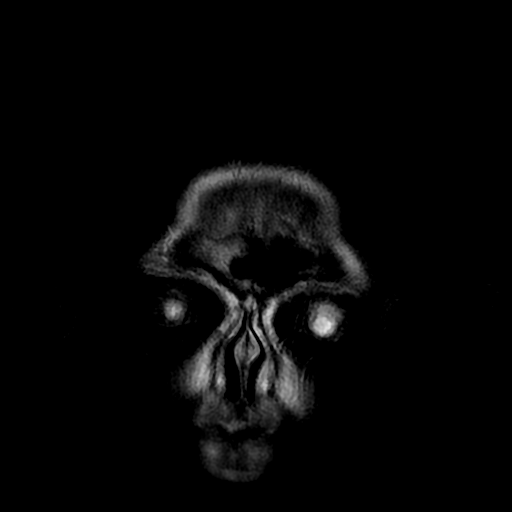

[Series 350: ADC · axial · 3.0mm · 0.94mm/px · z∈[-76,+70]mm · 3 of 48 slices shown (1 of 2)]
[im 1/48]
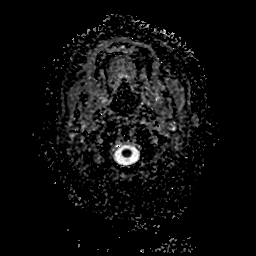
[im 24/48]
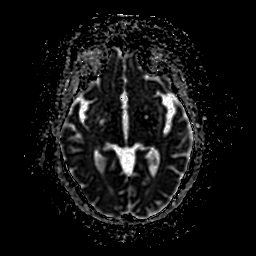
[im 48/48]
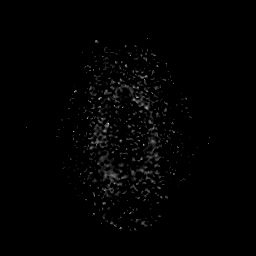

[Series 550: ADC · coronal · 4.0mm · 0.94mm/px · 2 of 34 slices shown (2 of 2)]
[im 1/34]
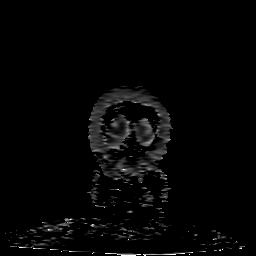
[im 34/34]
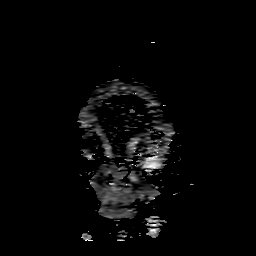

[30 of 48 positions shown; findings below may reference images not displayed]

FINDINGS: MRI HEAD FINDINGS

Brain: No acute infarction, hemorrhage, hydrocephalus, extra-axial
collection or mass lesion. Fewnonspecific foci of T2 FLAIR
hyperintense signal abnormality in subcortical and periventricular
white matter are compatible withmildchronic microvascular ischemic
changes for age. Mildbrain parenchymal volume loss.

Vascular: As below.

Skull and upper cervical spine: Normal marrow signal.

Sinuses/Orbits: No significant abnormal signal of paranasal sinuses
or mastoid air cells. Bilateral intra-ocular lens replacement.

Other: None.

MRA HEAD FINDINGS

Internal carotid arteries:  Patent.

Anterior cerebral arteries: Patent. Left larger than right A1
segments and anterior communicating artery, normal variant.

Middle cerebral arteries: Patent.  Mild left proximal M1 stenosis.

Anterior communicating artery: Patent.

Posterior communicating arteries: Patent. Fetal left PCA. Prominent
infundibulum of left ICA origin.

Posterior cerebral arteries: Patent. Accessory right PCA arising
from posterior communicating artery.

Basilar artery: Patent. Mid basilar segment of moderate to severe
stenosis.

Vertebral arteries:  Patent.

No evidence of high-grade stenosis, large vessel occlusion, or
aneurysm unless noted above.
IMPRESSION: MRI head:

1. No acute intracranial abnormality identified.
2. Mild for age chronic microvascular ischemic changes and mild
parenchymal volume loss of the brain.

MRA head:

1. Patent anterior and posterior circulation. No large vessel
occlusion or aneurysm.
2. Midbasilar short segment of moderate to severe stenosis.

By: Desailly Roger M.D.
